# Patient Record
Sex: Male | Born: 1945 | Race: White | Hispanic: No | Marital: Married | Smoking: Never smoker
Health system: Southern US, Community
[De-identification: ages and names within clinical notes are randomized; demographics above are authoritative.]

## PROBLEM LIST (undated history)

## (undated) DIAGNOSIS — E7800 Pure hypercholesterolemia, unspecified: Secondary | ICD-10-CM

## (undated) DIAGNOSIS — I2510 Atherosclerotic heart disease of native coronary artery without angina pectoris: Secondary | ICD-10-CM

## (undated) DIAGNOSIS — I214 Non-ST elevation (NSTEMI) myocardial infarction: Secondary | ICD-10-CM

## (undated) DIAGNOSIS — I10 Essential (primary) hypertension: Secondary | ICD-10-CM

## (undated) HISTORY — PX: FINGER FRACTURE SURGERY: SHX638

## (undated) HISTORY — PX: CATARACT EXTRACTION W/ INTRAOCULAR LENS  IMPLANT, BILATERAL: SHX1307

## (undated) HISTORY — PX: FRACTURE SURGERY: SHX138

## (undated) HISTORY — PX: CORONARY ANGIOPLASTY WITH STENT PLACEMENT: SHX49

---
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
## 2018-01-07 DIAGNOSIS — M25531 Pain in right wrist: Secondary | ICD-10-CM | POA: Diagnosis not present

## 2018-05-19 DIAGNOSIS — I214 Non-ST elevation (NSTEMI) myocardial infarction: Secondary | ICD-10-CM

## 2018-05-19 HISTORY — DX: Non-ST elevation (NSTEMI) myocardial infarction: I21.4

## 2018-05-20 ENCOUNTER — Inpatient Hospital Stay (HOSPITAL_COMMUNITY)
Admission: EM | Admit: 2018-05-20 | Discharge: 2018-05-21 | DRG: 247 | Disposition: A | Discharge status: Home / Self Care | Payer: Medicare Other | Attending: Cardiology | Admitting: Cardiology

## 2018-05-20 ENCOUNTER — Other Ambulatory Visit: Payer: Self-pay

## 2018-05-20 ENCOUNTER — Encounter (HOSPITAL_COMMUNITY): Payer: Self-pay | Admitting: *Deleted

## 2018-05-20 ENCOUNTER — Encounter (HOSPITAL_COMMUNITY)
Admission: EM | Disposition: A | Discharge status: Home / Self Care | Payer: Self-pay | Source: Home / Self Care | Attending: Cardiology

## 2018-05-20 ENCOUNTER — Emergency Department (HOSPITAL_COMMUNITY): Payer: Medicare Other

## 2018-05-20 DIAGNOSIS — Z888 Allergy status to other drugs, medicaments and biological substances status: Secondary | ICD-10-CM

## 2018-05-20 DIAGNOSIS — E785 Hyperlipidemia, unspecified: Secondary | ICD-10-CM | POA: Diagnosis present

## 2018-05-20 DIAGNOSIS — I25110 Atherosclerotic heart disease of native coronary artery with unstable angina pectoris: Secondary | ICD-10-CM | POA: Diagnosis present

## 2018-05-20 DIAGNOSIS — I10 Essential (primary) hypertension: Secondary | ICD-10-CM | POA: Diagnosis present

## 2018-05-20 DIAGNOSIS — Z79899 Other long term (current) drug therapy: Secondary | ICD-10-CM | POA: Diagnosis not present

## 2018-05-20 DIAGNOSIS — I214 Non-ST elevation (NSTEMI) myocardial infarction: Secondary | ICD-10-CM | POA: Diagnosis present

## 2018-05-20 DIAGNOSIS — Z9861 Coronary angioplasty status: Secondary | ICD-10-CM

## 2018-05-20 DIAGNOSIS — Z955 Presence of coronary angioplasty implant and graft: Secondary | ICD-10-CM

## 2018-05-20 DIAGNOSIS — R079 Chest pain, unspecified: Secondary | ICD-10-CM | POA: Diagnosis present

## 2018-05-20 HISTORY — DX: Atherosclerotic heart disease of native coronary artery without angina pectoris: I25.10

## 2018-05-20 HISTORY — DX: Pure hypercholesterolemia, unspecified: E78.00

## 2018-05-20 HISTORY — DX: Essential (primary) hypertension: I10

## 2018-05-20 HISTORY — PX: LEFT HEART CATH AND CORONARY ANGIOGRAPHY: CATH118249

## 2018-05-20 HISTORY — PX: CORONARY STENT INTERVENTION: CATH118234

## 2018-05-20 HISTORY — DX: Non-ST elevation (NSTEMI) myocardial infarction: I21.4

## 2018-05-20 LAB — BASIC METABOLIC PANEL
Anion gap: 8 (ref 5–15)
BUN: 17 mg/dL (ref 8–23)
CO2: 28 mmol/L (ref 22–32)
Calcium: 9.5 mg/dL (ref 8.9–10.3)
Chloride: 101 mmol/L (ref 98–111)
Creatinine, Ser: 1 mg/dL (ref 0.61–1.24)
GFR calc Af Amer: >60 mL/min (ref >60)
GFR calc non Af Amer: >60 mL/min (ref >60)
Glucose, Bld: 99 mg/dL (ref 70–99)
Potassium: 3.7 mmol/L (ref 3.5–5.1)
Sodium: 137 mmol/L (ref 135–145)

## 2018-05-20 LAB — I-STAT TROPONIN, ED: Troponin i, poc: 0.17 ng/mL (ref 0.00–0.08)

## 2018-05-20 LAB — LIPID PANEL
Cholesterol: 133 mg/dL (ref 0–200)
HDL: 63 mg/dL (ref >40)
LDL Cholesterol: 61 mg/dL (ref 0–99)
Total CHOL/HDL Ratio: 2.1 RATIO
Triglycerides: 46 mg/dL (ref <150)
VLDL: 9 mg/dL (ref 0–40)

## 2018-05-20 LAB — CBC
HCT: 41.2 % (ref 39.0–52.0)
Hemoglobin: 13.6 g/dL (ref 13.0–17.0)
MCH: 30 pg (ref 26.0–34.0)
MCHC: 33 g/dL (ref 30.0–36.0)
MCV: 90.9 fL (ref 80.0–100.0)
Platelets: 320 10*3/uL (ref 150–400)
RBC: 4.53 MIL/uL (ref 4.22–5.81)
RDW: 12.3 % (ref 11.5–15.5)
WBC: 7.2 10*3/uL (ref 4.0–10.5)
nRBC: 0 % (ref 0.0–0.2)

## 2018-05-20 LAB — TROPONIN I: Troponin I: 0.13 ng/mL (ref <0.03)

## 2018-05-20 LAB — POCT ACTIVATED CLOTTING TIME
Activated Clotting Time: 290 seconds
Activated Clotting Time: 323 seconds

## 2018-05-20 LAB — PROTIME-INR
INR: 0.92
Prothrombin Time: 12.3 seconds (ref 11.4–15.2)

## 2018-05-20 LAB — HEMOGLOBIN A1C
Hgb A1c MFr Bld: 5.2 % (ref 4.8–5.6)
Mean Plasma Glucose: 102.54 mg/dL

## 2018-05-20 SURGERY — LEFT HEART CATH AND CORONARY ANGIOGRAPHY
Anesthesia: LOCAL

## 2018-05-20 MED ORDER — METOPROLOL SUCCINATE ER 25 MG PO TB24
25.0000 mg | ORAL_TABLET | Freq: Every day | ORAL | Status: DC
  Administered 2018-05-21: 25 mg via ORAL
  Filled 2018-05-20: qty 1

## 2018-05-20 MED ORDER — HEPARIN (PORCINE) IN NACL 1000-0.9 UT/500ML-% IV SOLN
INTRAVENOUS | Status: DC | PRN
  Administered 2018-05-20 (×2): 500 mL

## 2018-05-20 MED ORDER — NITROGLYCERIN 1 MG/10 ML FOR IR/CATH LAB
INTRA_ARTERIAL | Status: AC
  Filled 2018-05-20: qty 10

## 2018-05-20 MED ORDER — MIDAZOLAM HCL 2 MG/2ML IJ SOLN
INTRAMUSCULAR | Status: AC
  Filled 2018-05-20: qty 2

## 2018-05-20 MED ORDER — ASPIRIN 81 MG PO CHEW
324.0000 mg | CHEWABLE_TABLET | Freq: Once | ORAL | Status: AC
  Administered 2018-05-20: 324 mg via ORAL
  Filled 2018-05-20: qty 4

## 2018-05-20 MED ORDER — LIDOCAINE HCL (PF) 1 % IJ SOLN
INTRAMUSCULAR | Status: DC | PRN
  Administered 2018-05-20: 2 mL

## 2018-05-20 MED ORDER — LOSARTAN POTASSIUM 50 MG PO TABS
50.0000 mg | ORAL_TABLET | Freq: Every day | ORAL | Status: DC
  Filled 2018-05-20: qty 1

## 2018-05-20 MED ORDER — ASPIRIN 81 MG PO CHEW: 81.0000 mg | CHEWABLE_TABLET | ORAL | Status: DC

## 2018-05-20 MED ORDER — LOSARTAN POTASSIUM 25 MG PO TABS
25.0000 mg | ORAL_TABLET | Freq: Once | ORAL | Status: AC
  Administered 2018-05-21: 25 mg via ORAL
  Filled 2018-05-20: qty 1

## 2018-05-20 MED ORDER — HEPARIN SODIUM (PORCINE) 1000 UNIT/ML IJ SOLN
INTRAMUSCULAR | Status: DC | PRN
  Administered 2018-05-20: 3500 [IU] via INTRAVENOUS
  Administered 2018-05-20: 3000 [IU] via INTRAVENOUS

## 2018-05-20 MED ORDER — ACETAMINOPHEN 325 MG PO TABS: 650.0000 mg | ORAL_TABLET | ORAL | Status: DC | PRN

## 2018-05-20 MED ORDER — VERAPAMIL HCL 2.5 MG/ML IV SOLN
INTRAVENOUS | Status: AC
  Filled 2018-05-20: qty 2

## 2018-05-20 MED ORDER — SODIUM CHLORIDE 0.9% FLUSH: 3.0000 mL | INTRAVENOUS | Status: DC | PRN

## 2018-05-20 MED ORDER — LABETALOL HCL 5 MG/ML IV SOLN: 10.0000 mg | INTRAVENOUS | Status: AC | PRN

## 2018-05-20 MED ORDER — SODIUM CHLORIDE 0.9 % IV SOLN: 250.0000 mL | INTRAVENOUS | Status: DC | PRN

## 2018-05-20 MED ORDER — NITROGLYCERIN 1 MG/10 ML FOR IR/CATH LAB
INTRA_ARTERIAL | Status: DC | PRN
  Administered 2018-05-20: 200 ug via INTRACORONARY

## 2018-05-20 MED ORDER — LIDOCAINE HCL (PF) 1 % IJ SOLN
INTRAMUSCULAR | Status: AC
  Filled 2018-05-20: qty 30

## 2018-05-20 MED ORDER — ASPIRIN 81 MG PO CHEW: 324.0000 mg | CHEWABLE_TABLET | ORAL | Status: DC

## 2018-05-20 MED ORDER — TIROFIBAN (AGGRASTAT) BOLUS VIA INFUSION
INTRAVENOUS | Status: DC | PRN
  Administered 2018-05-20: 1655 ug via INTRAVENOUS

## 2018-05-20 MED ORDER — HEPARIN (PORCINE) IN NACL 1000-0.9 UT/500ML-% IV SOLN
INTRAVENOUS | Status: AC
  Filled 2018-05-20: qty 1000

## 2018-05-20 MED ORDER — ONDANSETRON HCL 4 MG/2ML IJ SOLN: 4.0000 mg | Freq: Four times a day (QID) | INTRAMUSCULAR | Status: DC | PRN

## 2018-05-20 MED ORDER — NITROGLYCERIN 0.4 MG SL SUBL: 0.4000 mg | SUBLINGUAL_TABLET | SUBLINGUAL | Status: DC | PRN

## 2018-05-20 MED ORDER — TICAGRELOR 90 MG PO TABS
90.0000 mg | ORAL_TABLET | Freq: Two times a day (BID) | ORAL | Status: DC
  Administered 2018-05-20 – 2018-05-21 (×2): 90 mg via ORAL
  Filled 2018-05-20 (×2): qty 1

## 2018-05-20 MED ORDER — ASPIRIN EC 81 MG PO TBEC: 81.0000 mg | DELAYED_RELEASE_TABLET | Freq: Every day | ORAL | Status: DC

## 2018-05-20 MED ORDER — ASPIRIN 300 MG RE SUPP: 300.0000 mg | RECTAL | Status: DC

## 2018-05-20 MED ORDER — IOHEXOL 350 MG/ML SOLN
INTRAVENOUS | Status: DC | PRN
  Administered 2018-05-20: 125 mL via INTRACARDIAC

## 2018-05-20 MED ORDER — METOPROLOL SUCCINATE ER 25 MG PO TB24: 25.0000 mg | ORAL_TABLET | Freq: Every day | ORAL | Status: DC

## 2018-05-20 MED ORDER — HEPARIN SODIUM (PORCINE) 1000 UNIT/ML IJ SOLN
INTRAMUSCULAR | Status: AC
  Filled 2018-05-20: qty 1

## 2018-05-20 MED ORDER — VERAPAMIL HCL 2.5 MG/ML IV SOLN
INTRAVENOUS | Status: DC | PRN
  Administered 2018-05-20: 10 mL via INTRA_ARTERIAL

## 2018-05-20 MED ORDER — MIDAZOLAM HCL 2 MG/2ML IJ SOLN
INTRAMUSCULAR | Status: DC | PRN
  Administered 2018-05-20: 1 mg via INTRAVENOUS

## 2018-05-20 MED ORDER — LOSARTAN POTASSIUM 25 MG PO TABS: 25.0000 mg | ORAL_TABLET | Freq: Every day | ORAL | Status: DC

## 2018-05-20 MED ORDER — HYDRALAZINE HCL 20 MG/ML IJ SOLN: 5.0000 mg | INTRAMUSCULAR | Status: AC | PRN

## 2018-05-20 MED ORDER — SODIUM CHLORIDE 0.9% FLUSH
3.0000 mL | Freq: Two times a day (BID) | INTRAVENOUS | Status: DC
  Administered 2018-05-20 – 2018-05-21 (×2): 3 mL via INTRAVENOUS

## 2018-05-20 MED ORDER — FENTANYL CITRATE (PF) 100 MCG/2ML IJ SOLN
INTRAMUSCULAR | Status: DC | PRN
  Administered 2018-05-20: 50 ug via INTRAVENOUS

## 2018-05-20 MED ORDER — SODIUM CHLORIDE 0.9 % IV SOLN: INTRAVENOUS | Status: DC

## 2018-05-20 MED ORDER — HEPARIN (PORCINE) 25000 UT/250ML-% IV SOLN: 750.0000 [IU]/h | INTRAVENOUS | Status: DC

## 2018-05-20 MED ORDER — HEPARIN BOLUS VIA INFUSION
4000.0000 [IU] | Freq: Once | INTRAVENOUS | Status: DC
  Filled 2018-05-20: qty 4000

## 2018-05-20 MED ORDER — FENTANYL CITRATE (PF) 100 MCG/2ML IJ SOLN
INTRAMUSCULAR | Status: AC
  Filled 2018-05-20: qty 2

## 2018-05-20 MED ORDER — ATORVASTATIN CALCIUM 80 MG PO TABS
80.0000 mg | ORAL_TABLET | Freq: Every day | ORAL | Status: DC
  Administered 2018-05-21: 80 mg via ORAL
  Filled 2018-05-20: qty 1

## 2018-05-20 MED ORDER — HEPARIN (PORCINE) 25000 UT/250ML-% IV SOLN
750.0000 [IU]/h | INTRAVENOUS | Status: AC
  Administered 2018-05-20: 750 [IU]/h via INTRAVENOUS
  Filled 2018-05-20: qty 250

## 2018-05-20 MED ORDER — SODIUM CHLORIDE 0.9 % IV SOLN: INTRAVENOUS | Status: AC

## 2018-05-20 SURGICAL SUPPLY — 20 items
BALLN SAPPHIRE 3.0X12 (BALLOONS) ×2
BALLOON SAPPHIRE 3.0X12 (BALLOONS) ×1 IMPLANT
CATH INFINITI 5FR ANG PIGTAIL (CATHETERS) ×2 IMPLANT
CATH OPTICROSS HD (CATHETERS) ×2 IMPLANT
CATH OPTITORQUE TIG 4.0 5F (CATHETERS) ×2 IMPLANT
CATH VISTA GUIDE 6FR JR4 (CATHETERS) ×2 IMPLANT
DEVICE RAD COMP TR BAND LRG (VASCULAR PRODUCTS) ×2 IMPLANT
GLIDESHEATH SLEND A-KIT 6F 22G (SHEATH) ×2 IMPLANT
GUIDEWIRE INQWIRE 1.5J.035X260 (WIRE) ×1 IMPLANT
INQWIRE 1.5J .035X260CM (WIRE) ×2
KIT ENCORE 26 ADVANTAGE (KITS) ×2 IMPLANT
KIT HEART LEFT (KITS) ×2 IMPLANT
KIT HEMO VALVE WATCHDOG (MISCELLANEOUS) ×2 IMPLANT
PACK CARDIAC CATHETERIZATION (CUSTOM PROCEDURE TRAY) ×2 IMPLANT
SLED PULL BACK IVUS (MISCELLANEOUS) ×2 IMPLANT
STENT RESOLUTE ONYX 4.5X15 (Permanent Stent) ×2 IMPLANT
SYR MEDRAD MARK 7 150ML (SYRINGE) ×2 IMPLANT
TRANSDUCER W/STOPCOCK (MISCELLANEOUS) ×2 IMPLANT
TUBING CIL FLEX 10 FLL-RA (TUBING) ×2 IMPLANT
WIRE ASAHI PROWATER 180CM (WIRE) ×2 IMPLANT

## 2018-05-20 NOTE — ED Provider Notes (Signed)
[facility] EMERGENCY DEPARTMENT Provider Note   CSN: [id number] Arrival date & time: 05/20/18  1023     History   Chief Complaint Chief Complaint  Patient presents with  . Chest Pain    HPI Tracy Figueroa is a 72 y.o. male.  Patient with history of known coronary artery disease, status post stent placement done in [place] on 04/12/18 --presents today with episodic chest pressure, no active chest symptoms.  Patient states that prior to being seen in [place], he developed some exertional chest pressure with radiation to the left jaw.  He went to a cardiologist in [place] who performed a catheterization because his symptoms were continuing to occur.  It sounds like he had one stent placed in a branch of a coronary artery.  However, his symptoms continued.  He then had a stress test which was reportedly negative with a normal EKG.  Upon returning to the [place], he continued to have exertional symptoms, resolved with nitroglycerin.  He saw cardiologist.  They reviewed his catheterization and noted another area that could have been a problem.  Patient reports having an episode of chest pain while resting in bed last night.  He denies associated diaphoresis, vomiting.  He was advised by his cardiologist to come to the emergency department today.  Again, he is currently asymptomatic.  States that he has been compliant with Brilinta.     Past Medical History:  Diagnosis Date  . Coronary artery disease     There are no active problems to display for this patient.   History reviewed. No pertinent surgical history.      Home Medications    Prior to Admission medications   Not on File    Family History No family history on file.  Social History Social History   Tobacco Use  . Smoking status: Not on file  Substance Use Topics  . Alcohol use: Not on file  . Drug use: Not on file     Allergies   Amlodipine   Review of Systems Review of  Systems  Constitutional: Negative for diaphoresis and fever.  Eyes: Negative for redness.  Respiratory: Negative for cough and shortness of breath.   Cardiovascular: Positive for chest pain. Negative for palpitations and leg swelling.  Gastrointestinal: Negative for abdominal pain, nausea and vomiting.  Genitourinary: Negative for dysuria.  Musculoskeletal: Negative for back pain and neck pain.  Skin: Negative for rash.  Neurological: Negative for syncope and light-headedness.  Psychiatric/Behavioral: The patient is not nervous/anxious.      Physical Exam Updated Vital Signs BP (!) 146/81 (BP Location: Right Arm)   Pulse (!) 49   Temp 98.6 F (37 C) (Oral)   Resp 16   SpO2 99%   Physical Exam Vitals signs and nursing note reviewed.  Constitutional:      Appearance: He is well-developed. He is not diaphoretic.  HENT:     Head: Normocephalic and atraumatic.     Mouth/Throat:     Mouth: Mucous membranes are not dry.  Eyes:     Conjunctiva/sclera: Conjunctivae normal.  Neck:     Musculoskeletal: Normal range of motion and neck supple. No muscular tenderness.     Vascular: Normal carotid pulses. No carotid bruit or JVD.     Trachea: Trachea normal. No tracheal deviation.  Cardiovascular:     Rate and Rhythm: Normal rate and regular rhythm.     Pulses: No decreased pulses.     Heart sounds: Normal heart  sounds, S1 normal and S2 normal. Heart sounds not distant. No murmur.  Pulmonary:     Effort: Pulmonary effort is normal. No respiratory distress.     Breath sounds: Normal breath sounds. No wheezing.  Chest:     Chest wall: No tenderness.  Abdominal:     General: Bowel sounds are normal.     Palpations: Abdomen is soft.     Tenderness: There is no abdominal tenderness. There is no guarding or rebound.  Skin:    General: Skin is warm and dry.     Coloration: Skin is not pale.  Neurological:     Mental Status: He is alert.      ED Treatments / Results  Labs (all  labs ordered are listed, but only abnormal results are displayed) Labs Reviewed  I-STAT TROPONIN, ED - Abnormal; Notable for the following components:      Result Value   Troponin i, poc 0.17 (*)    All other components within normal limits  BASIC METABOLIC PANEL  CBC  PROTIME-INR  TROPONIN I  HIV ANTIBODY (ROUTINE TESTING W REFLEX)  CBG MONITORING, ED    EKG EKG Interpretation  Date/Time:  Thursday May 20 2018 10:36:54 EST Ventricular Rate:  55 PR Interval:  166 QRS Duration: 82 QT Interval:  442 QTC Calculation: 422 R Axis:   65 Text Interpretation:  Sinus bradycardia with sinus arrhythmia Abnormal QRS-T angle, consider primary T wave abnormality Abnormal ECG no prior available for comparison Confirmed by Tilden Fossaees, Elizabeth [phone]([id number]) on 05/20/2018 10:56:50 AM   Radiology Dg Chest 2 View  Result Date: 05/20/2018 CLINICAL DATA:  Intermittent chest pain EXAM: CHEST - 2 VIEW COMPARISON:  None FINDINGS: Normal heart size, mediastinal contours, and pulmonary vascularity. Coronary arterial stent noted. Emphysematous changes without infiltrate, pleural effusion, or pneumothorax. EKG leads project over the upper chest. Bones demineralized with scattered costal cartilaginous calcifications. IMPRESSION: COPD changes without acute infiltrate. Electronically Signed   By: Ulyses SouthwardMark  Boles M.D.   On: 05/20/2018 11:11    Procedures Procedures (including critical care time)  Medications Ordered in ED Medications  aspirin chewable tablet 324 mg (has no administration in time range)  aspirin EC tablet 81 mg (has no administration in time range)  nitroGLYCERIN (NITROSTAT) SL tablet 0.4 mg (has no administration in time range)  acetaminophen (TYLENOL) tablet 650 mg (has no administration in time range)  ondansetron (ZOFRAN) injection 4 mg (has no administration in time range)     Initial Impression / Assessment and Plan / ED Course  I have reviewed the triage vital signs and the nursing  notes.  Pertinent labs & imaging results that were available during my care of the patient were reviewed by me and considered in my medical decision making (see chart for details).     Patient seen and examined. Work-up initiated. Medications ordered.   Vital signs reviewed and are as follows: BP (!) 146/81 (BP Location: Right Arm)   Pulse (!) 49   Temp 98.6 F (37 C) (Oral)   Resp 16   SpO2 99%   EKG reviewed by myself and by Dr. Madilyn Hookees.  There is approximately 1/2 mm of elevation in aVL with slight depression inferolaterally.  Patient has T wave inversion noted in lead III.  No old EKG for comparison.  Troponin elevated.  11:45 AM Orders placed for admission by cardiology.   CRITICAL CARE Performed by: Renne CriglerJoshua Rishard Delange PA-C Total critical care time: 30 minutes Critical care time was exclusive of separately billable  procedures and treating other patients. Critical care was necessary to treat or prevent imminent or life-threatening deterioration. Critical care was time spent personally by me on the following activities: development of treatment plan with patient and/or surrogate as well as nursing, discussions with consultants, evaluation of patient's response to treatment, examination of patient, obtaining history from patient or surrogate, ordering and performing treatments and interventions, ordering and review of laboratory studies, ordering and review of radiographic studies, pulse oximetry and re-evaluation of patient's condition.   Final Clinical Impressions(s) / ED Diagnoses   Final diagnoses:  NSTEMI (non-ST elevated myocardial infarction) (HCC)   Admit.    ED Discharge Orders    None       Renne CriglerGeiple, Terrea Bruster, Cordelia Poche-C 05/20/18 1147    Tilden Fossaees, Elizabeth, MD 05/21/18 [phone]

## 2018-05-20 NOTE — Interval H&P Note (Signed)
History and Physical Interval Note:  05/20/2018 2:03 PM  Tracy SnowJonathan Richard Figueroa  has presented today for surgery, with the diagnosis of nstemi  The various methods of treatment have been discussed with the patient and family. After consideration of risks, benefits and other options for treatment, the patient has consented to  Procedure(s): LEFT HEART CATH AND CORONARY ANGIOGRAPHY (N/A) as a surgical intervention .  The patient's history has been reviewed, patient examined, no change in status, stable for surgery.  I have reviewed the patient's chart and labs.  Questions were answered to the patient's satisfaction.    2016 Appropriate Use Criteria for Coronary Revascularization in Patients With Acute Coronary Syndrome NSTEMI/UA High Risk (TIMI Score 5-7) NSTEMI/Unstable angina, stabilized patient at high risk Link Here: https://powell.info/http://scaiaucapp.org/redirect/qit?177=412&189=435&190=439&192=444&193=447 Indication:  Revascularization by PCI or CABG of 1 or more arteries in a patient with NSTEMI or unstable angina with Stabilization after presentation High risk for clinical events  A (7) Indication: 16; Score 7    Jaedin Regina J Linnette Panella

## 2018-05-20 NOTE — Progress Notes (Signed)
ANTICOAGULATION CONSULT NOTE   Pharmacy Consult for heparin Indication: chest pain/ACS  Allergies  Allergen Reactions  . Amlodipine Swelling    Leg swelling    Patient Measurements: Height: 5\' 10"  (177.8 cm) Weight: 146 lb (66.2 kg) IBW/kg (Calculated) : 73  Vital Signs: Temp: 98.6 F (37 C) (12/19 1030) Temp Source: Oral (12/19 1030) BP: 126/76 (12/19 1600) Pulse Rate: 0 (12/19 1533)  Labs: Recent Labs    05/20/18 1048 05/20/18 1206  HGB 13.6  --   HCT 41.2  --   PLT 320  --   LABPROT  --  12.3  INR  --  0.92  CREATININE 1.00  --   TROPONINI  --  0.13*    Estimated Creatinine Clearance: 64.4 mL/min (by C-G formula based on SCr of 1 mg/dL).  Assessment: CC/HPI: 72 yo m presenting w CP  PMH: CAD - recent stent in [place] in [place]  Anticoag: none pta - iv hep for r/o acs  Renal: SCr 1  Heme/Onc: H&H 13.6/41.2, Plt 320  Now s/p cath - asked by Dr. Rosemary HolmsPatwardhan to start heparin when TR band off, and continue for 12 hrs.  Band off ~ 745 PM.  Goal of Therapy:  Heparin level 0.3-0.7 units/ml Monitor platelets by anticoagulation protocol: Yes   Plan:  Start IV heparin now at 750 units/hr (no bolus) Then check heparin level 8 hrs after heparin starts. Heparin off at 0830 tomorrow. CBC in AM.  Reece LeaderJessica Keontae Levingston, Colon FlatteryPharm D, BCPS, [facility] Clinical Pharmacist Phone [phone]  05/20/2018 8:15 PM

## 2018-05-20 NOTE — Progress Notes (Signed)
ANTICOAGULATION CONSULT NOTE - Initial Consult  Pharmacy Consult for heparin Indication: chest pain/ACS  Allergies  Allergen Reactions  . Amlodipine Swelling    Leg swelling    Patient Measurements: Height: 5\' 10"  (177.8 cm) Weight: 146 lb (66.2 kg) IBW/kg (Calculated) : 73  Vital Signs: Temp: 98.6 F (37 C) (12/19 1030) Temp Source: Oral (12/19 1030) BP: 146/81 (12/19 1030) Pulse Rate: 49 (12/19 1030)  Labs: Recent Labs    05/20/18 1048  HGB 13.6  HCT 41.2  PLT 320  CREATININE 1.00    Estimated Creatinine Clearance: 64.4 mL/min (by C-G formula based on SCr of 1 mg/dL).  Assessment: CC/HPI: 72 yo m presenting w CP  PMH: CAD - recent stent in placed in [place]  Anticoag: none pta - iv hep for r/o acs  Renal: SCr 1  Heme/Onc: H&H 13.6/41.2, Plt 320  Goal of Therapy:  Heparin level 0.3-0.7 units/ml Monitor platelets by anticoagulation protocol: Yes   Plan:  Heparin bolus 4000 units x 1 Heparin drip 750 units/hr Initial HL 2000 Daily HL CBC  Isaac BlissMichael Arvie Villarruel, PharmD, BCPS, BCCCP Clinical Pharmacist [phone]  Please check AMION for all [facility] numbers  05/20/2018 12:10 PM

## 2018-05-20 NOTE — ED Notes (Signed)
Pt taken to Xray.

## 2018-05-20 NOTE — ED Notes (Signed)
Lisabeth PickKaleigh, RN notified re: 0.13 trop and agrees to inform cardiology

## 2018-05-20 NOTE — ED Triage Notes (Signed)
To ED for eval by cardiology. Pt has had intermittent cp over the past few  months while out of the country. Pt states he had a cath with stent placement in [place] 11/11 but still with pain intermittently. Seen by cardiology here and stress test completed. Pain again last pm and this am - told to come to ED for workup. No pain now. No SOB

## 2018-05-20 NOTE — H&P (Signed)
Tracy Figueroa is an 72 y.o. male.   Chief Complaint: Chest pain  HPI:   72 y/o [name] with coronary artery disease with recent OCI to LCx/OM while in [place], now with recurrent, unstable angina symtoms.   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 He was in [place] in 04/2018 when he saw a cardiologist after progressive angina symotoms. Records from [place], mostly in [place], with partial translation to [place], were reviewed. I personally reviewed the diagnostic films from [place], although I was unable to open the interventional films. Per the records, he underwent PCI to his LCx/OM (2.25 X 18 mm DES), He has at least 50% lesion in mid section of a large, dominant RCA that was treated medically. With continued chest pain, he underwent exercise treadmill stress test with good exercise capacity, that was reported normal. Inferolateral EKG strips were not available to me to review.   [place] the [place], patient has conintued to have angina on exertion, as well as episdoes at rest. I recommended starting on antignainal therapy with metoprolol succinate 25 mg daily, as needed SL NTG, and continues his DAPT with Aspirin/Brilinta, Lipitor, reduced losartan from 50 mg to 25 mg givn his borderline blood pressure. I recommended 4 week follow up to assess his response to medical therapy.   Patient called this morning, reporting 3 episodes of angnina in the last 24 hours, two while walking, one while in bed. He was thus asked to come to the Emergency room for further evaluation. In the ED, EKG showed inferior T wave inversion and Trop elevation to 0.17. Patient is currently chest pain free.  Past Medical History:  Diagnosis Date  . Coronary artery disease     History reviewed. No pertinent surgical history.  No family history on file. Social History:  has no history on file for tobacco, alcohol, and  drug.  Allergies:  Allergies  Allergen Reactions  . Amlodipine Swelling    Leg swelling    (Not in a hospital admission)   Results for orders placed or performed during the hospital encounter of 05/20/18 (from the past 48 hour(s))  Basic metabolic panel     Status: None   Collection Time: 05/20/18 10:48 AM  Result Value Ref Range   Sodium 137 135 - 145 mmol/L   Potassium 3.7 3.5 - 5.1 mmol/L   Chloride 101 98 - 111 mmol/L   CO2 28 22 - 32 mmol/L   Glucose, Bld 99 70 - 99 mg/dL   BUN 17 8 - 23 mg/dL   Creatinine, Ser 1.611.00 0.61 - 1.24 mg/dL   Calcium 9.5 8.9 - 09.610.3 mg/dL   GFR calc non Af Amer >60 >60 mL/min   GFR calc Af Amer >60 >60 mL/min   Anion gap 8 5 - 15    Comment: Performed at [facility], [address] [place], [place] [address]  CBC     Status: None   Collection Time: 05/20/18 10:48 AM  Result Value Ref Range   WBC 7.2 4.0 - 10.5 K/uL   RBC 4.53 4.22 - 5.81 MIL/uL   Hemoglobin 13.6 13.0 - 17.0 g/dL   HCT 98.141.2 19.139.0 - 47.852.0 %   MCV 90.9 80.0 - 100.0 fL   MCH 30.0 26.0 - 34.0 pg   MCHC 33.0 30.0 - 36.0 g/dL   RDW 29.512.3 62.111.5 - 30.815.5 %   Platelets 320 150 -  400 K/uL   nRBC 0.0 0.0 - 0.2 %    Comment: Performed at [facility], [address], [place], [place] [address]  I-stat troponin, ED     Status: Abnormal   Collection Time: 05/20/18 10:51 AM  Result Value Ref Range   Troponin i, poc 0.17 (HH) 0.00 - 0.08 ng/mL   Comment NOTIFIED PHYSICIAN    Comment 3            Comment: Due to the release kinetics of cTnI, a negative result within the first hours of the onset of symptoms does not rule out myocardial infarction with certainty. If myocardial infarction is still suspected, repeat the test at appropriate intervals.    Dg Chest 2 View  Result Date: 05/20/2018 CLINICAL DATA:  Intermittent chest pain EXAM: CHEST - 2 VIEW COMPARISON:  None FINDINGS: Normal heart size, mediastinal contours, and pulmonary vascularity. Coronary arterial stent  noted. Emphysematous changes without infiltrate, pleural effusion, or pneumothorax. EKG leads project over the upper chest. Bones demineralized with scattered costal cartilaginous calcifications. IMPRESSION: COPD changes without acute infiltrate. Electronically Signed   By: Ulyses Southward M.D.   On: 05/20/2018 11:11    Review of Systems  Constitutional: Negative.   HENT: Negative.   Respiratory: Negative for shortness of breath.   Cardiovascular: Positive for chest pain (Currently chest pain free). Negative for palpitations, leg swelling and PND.  Gastrointestinal: Negative for nausea and vomiting.  Genitourinary: Negative.   Musculoskeletal: Negative.   Skin: Negative.   Neurological: Negative for dizziness and loss of consciousness.  Endo/Heme/Allergies: Does not bruise/bleed easily.  Psychiatric/Behavioral: Negative.   All other systems reviewed and are negative.   Blood pressure (!) 146/81, pulse (!) 49, temperature 98.6 F (37 C), temperature source Oral, resp. rate 16, SpO2 99 %. Physical Exam  Nursing note and vitals reviewed. Constitutional: He is oriented to person, place, and time. He appears well-developed and well-nourished. No distress.  HENT:  Head: Normocephalic and atraumatic.  Eyes: Pupils are equal, round, and reactive to light. Conjunctivae are normal.  Neck: No JVD present.  Cardiovascular: Regular rhythm, normal heart sounds and intact distal pulses. Bradycardia present. Exam reveals no gallop.  No murmur heard. GI: Soft. Bowel sounds are normal. There is no abdominal tenderness.  Musculoskeletal:        General: No tenderness or edema.  Lymphadenopathy:    He has no cervical adenopathy.  Neurological: He is alert and oriented to person, place, and time.  Skin: Skin is warm and dry.  Psychiatric: He has a normal mood and affect.    Cardiac studies: EKG 05/20/2018: Sinus rhythm 55 bpm. Normal axis. Normal conduction. Inferior T wave inversion suggestive of  ischemia.   Cath 04/12/2018: LM: Normal LAD: Mild disease LCx: Severe distal disease-->2.25 X 18 mm DES RCA: Large dominant. Mid 50-60% stenosis.  Echocardiogram   Assessment/Plan:  72 y/o [place] man with coronary artery disease (PCI to OM in 04/2018 while in [place]), now admitted with chest pain,  NSTEMI.  NSTEMI: Likely culprit is mid vessel 50-60% stenosis in large dominant RCA. Admit to telemetry. Continue aspirin/Brilinta, Start IV heparin. Continue metoprolol succinate 25 mg daily, increase losartan back to 50 mg, lipitor 80 mg daily. Hold Isosorbide mononitrate for now. Plan to cath this afternoon.  Hyperlipidemia: Continue Lipitor. Check lipid panel today.  Hypertension: As above  Elder Negus, MD 05/20/2018, 11:44 AM  Elder Negus, MD [facility]. [place] Pager: [phone] Office: [phone] If no  answer Cell [phone]

## 2018-05-21 ENCOUNTER — Encounter (HOSPITAL_COMMUNITY): Payer: Self-pay | Admitting: Cardiology

## 2018-05-21 LAB — BASIC METABOLIC PANEL
Anion gap: 10 (ref 5–15)
BUN: 16 mg/dL (ref 8–23)
CO2: 24 mmol/L (ref 22–32)
Calcium: 8.9 mg/dL (ref 8.9–10.3)
Chloride: 106 mmol/L (ref 98–111)
Creatinine, Ser: 0.97 mg/dL (ref 0.61–1.24)
GFR calc Af Amer: >60 mL/min (ref >60)
GFR calc non Af Amer: >60 mL/min (ref >60)
Glucose, Bld: 99 mg/dL (ref 70–99)
Potassium: 3.6 mmol/L (ref 3.5–5.1)
Sodium: 140 mmol/L (ref 135–145)

## 2018-05-21 LAB — CBC WITH DIFFERENTIAL/PLATELET
Abs Immature Granulocytes: 0.01 10*3/uL (ref 0.00–0.07)
Basophils Absolute: 0 10*3/uL (ref 0.0–0.1)
Basophils Relative: 1 %
Eosinophils Absolute: 0.6 10*3/uL — ABNORMAL HIGH (ref 0.0–0.5)
Eosinophils Relative: 7 %
HCT: 36.8 % — ABNORMAL LOW (ref 39.0–52.0)
Hemoglobin: 12.3 g/dL — ABNORMAL LOW (ref 13.0–17.0)
Immature Granulocytes: 0 %
Lymphocytes Relative: 21 %
Lymphs Abs: 1.8 10*3/uL (ref 0.7–4.0)
MCH: 29.5 pg (ref 26.0–34.0)
MCHC: 33.4 g/dL (ref 30.0–36.0)
MCV: 88.2 fL (ref 80.0–100.0)
Monocytes Absolute: 0.7 10*3/uL (ref 0.1–1.0)
Monocytes Relative: 8 %
Neutro Abs: 5.4 10*3/uL (ref 1.7–7.7)
Neutrophils Relative %: 63 %
Platelets: 282 10*3/uL (ref 150–400)
RBC: 4.17 MIL/uL — ABNORMAL LOW (ref 4.22–5.81)
RDW: 12.3 % (ref 11.5–15.5)
WBC: 8.5 10*3/uL (ref 4.0–10.5)
nRBC: 0 % (ref 0.0–0.2)

## 2018-05-21 LAB — HIV ANTIBODY (ROUTINE TESTING W REFLEX): HIV Screen 4th Generation wRfx: NONREACTIVE

## 2018-05-21 LAB — HEPARIN LEVEL (UNFRACTIONATED): Heparin Unfractionated: 0.34 IU/mL (ref 0.30–0.70)

## 2018-05-21 MED ORDER — LOSARTAN POTASSIUM 50 MG PO TABS: 25.0000 mg | ORAL_TABLET | Freq: Every day | ORAL | 3 refills | Status: DC

## 2018-05-21 NOTE — Plan of Care (Signed)
  Problem: Education: Goal: Knowledge of General Education information will improve Description Including pain rating scale, medication(s)/side effects and non-pharmacologic comfort measures Outcome: Progressing   

## 2018-05-21 NOTE — Progress Notes (Signed)
CARDIAC REHAB PHASE I   PRE:  Rate/Rhythm: 78 SR  BP:  Sitting: 145/82      SaO2:   MODE:  Ambulation: 340 ft   POST:  Rate/Rhythm: 98 SR, PVCs  BP:  Sitting: 155/89      SaO2:   Pt ambulated well. Reported SOB at the end of the walk. Walked with one assist. Educated on [place], [place], stent, diet, and exercise. Pt showed eagerness to exercise and was responsive to information. Pt received stent card. Pt to chair with phone and call bell at reach. Stent and heart attack video set up for Pt. CRPII was discussed and will [facility].   10:00-11:10   Tomasita CrumbleBrittany N Shereta Crothers BS, ACSM CEP  11:01 AM 05/21/2018

## 2018-05-21 NOTE — Progress Notes (Signed)
ANTICOAGULATION CONSULT NOTE   Pharmacy Consult for Heparin Indication: chest pain/ACS  Allergies  Allergen Reactions  . Amlodipine Swelling    Leg swelling    Patient Measurements: Height: 5\' 10"  (177.8 cm) Weight: 146 lb (66.2 kg) IBW/kg (Calculated) : 73  Vital Signs: Temp: 98.3 F (36.8 C) (12/20 0346) Temp Source: Oral (12/20 0346) BP: 132/88 (12/20 0346) Pulse Rate: 75 (12/20 0346)  Labs: Recent Labs    05/20/18 1048 05/20/18 1206 05/21/18 0353  HGB 13.6  --  12.3*  HCT 41.2  --  36.8*  PLT 320  --  282  LABPROT  --  12.3  --   INR  --  0.92  --   HEPARINUNFRC  --   --  0.34  CREATININE 1.00  --  0.97  TROPONINI  --  0.13*  --     Estimated Creatinine Clearance: 66.4 mL/min (by C-G formula based on SCr of 0.97 mg/dL).  Assessment: CC/HPI: 72 yo m presenting w CP  PMH: CAD - recent stent in placed in [place]  Anticoag: none pta - iv hep for r/o acs  Renal: SCr 1  Heme/Onc: H&H 13.6/41.2, Plt 320  Now s/p cath - asked by Dr. Rosemary HolmsPatwardhan to start heparin when TR band off, and continue for 12 hrs.  Band off ~ 745 PM.  12/20 AM update: heparin level therapeutic this AM, heparin off in a few hours  Goal of Therapy:  Heparin level 0.3-0.7 units/ml Monitor platelets by anticoagulation protocol: Yes   Plan:  Cont heparin at 750 units/hr Heparin off at 0830 this AM   Abran DukeJames Gwendloyn Forsee, PharmD, BCPS Clinical Pharmacist Phone: [phone]

## 2018-05-22 DIAGNOSIS — Z9861 Coronary angioplasty status: Secondary | ICD-10-CM

## 2018-05-22 NOTE — Discharge Summary (Addendum)
Physician Discharge Summary  Patient ID: Tracy SnowJonathan Figueroa Figueroa MRN: [id number] DOB/AGE: December 06, 1945 72 y.o.  Admit date: 05/20/2018 Discharge date: 05/22/2018  Admission Diagnoses: Chest pain  Discharge Diagnoses:  Active Problems:   NSTEMI (non-ST elevated myocardial infarction) (HCC)   S/P PTCA (percutaneous transluminal coronary angioplasty)   Discharged Condition: Good  Hospital Course:   72 y/o [name] with coronary artery disease (PCI to OM in 04/2018 while in [place]), was admitted to hospital on 05/20/18 with chest pain and found to have NSTEMI with peak trop 0.17 ng/mL. Cath showed 95% mid RCA stenosis successfully treated with DES, confirmed by IVUS. Details below. Patient ambulated with cardiac rehab without any chest pain. Transition of care follow up arranged.    Consults: None  Significant Diagnostic Studies: Results for Tracy SnowUDGE, Keir Figueroa (MRN [id number]) as of 05/22/2018 00:48  Ref. Range 05/21/2018 03:53  WBC Latest Ref Range: 4.0 - 10.5 K/uL 8.5  RBC Latest Ref Range: 4.22 - 5.81 MIL/uL 4.17 (L)  Hemoglobin Latest Ref Range: 13.0 - 17.0 g/dL 56.212.3 (L)  HCT Latest Ref Range: 39.0 - 52.0 % 36.8 (L)  MCV Latest Ref Range: 80.0 - 100.0 fL 88.2  MCH Latest Ref Range: 26.0 - 34.0 pg 29.5  MCHC Latest Ref Range: 30.0 - 36.0 g/dL 13.033.4  RDW Latest Ref Range: 11.5 - 15.5 % 12.3  Platelets Latest Ref Range: 150 - 400 K/uL 282  nRBC Latest Ref Range: 0.0 - 0.2 % 0.0   Results for Tracy SnowUDGE, Tracy Figueroa (MRN [id number]) as of 05/22/2018 00:48  Ref. Range 05/20/2018 10:48 05/20/2018 10:51 05/20/2018 12:06 05/21/2018 03:53  BASIC METABOLIC PANEL Unknown Rpt   Rpt  Sodium Latest Ref Range: 135 - 145 mmol/L 137   140  Potassium Latest Ref Range: 3.5 - 5.1 mmol/L 3.7   3.6  Chloride Latest Ref Range: 98 - 111 mmol/L 101   106  CO2 Latest Ref Range: 22 - 32 mmol/L 28   24  Glucose Latest Ref Range: 70 - 99 mg/dL 99   99  Mean Plasma Glucose Latest Units:  mg/dL 295.28102.54     BUN Latest Ref Range: 8 - 23 mg/dL 17   16  Creatinine Latest Ref Range: 0.61 - 1.24 mg/dL 4.131.00   2.440.97  Calcium Latest Ref Range: 8.9 - 10.3 mg/dL 9.5   8.9  Anion gap Latest Ref Range: 5 - 15  8   10   GFR, Est Non African American Latest Ref Range: >60 mL/min >60   >60  GFR, Est African American Latest Ref Range: >60 mL/min >60   >60  Troponin I Latest Ref Range: <0.03 ng/mL   0.13 (HH)   Troponin i, poc Latest Ref Range: 0.00 - 0.08 ng/mL  0.17 (HH)    Total CHOL/HDL Ratio Latest Units: RATIO 2.1     Cholesterol Latest Ref Range: 0 - 200 mg/dL 010133     HDL Cholesterol Latest Ref Range: >40 mg/dL 63     LDL (calc) Latest Ref Range: 0 - 99 mg/dL 61     Triglycerides Latest Ref Range: <150 mg/dL 46     VLDL Latest Ref Range: 0 - 40 mg/dL 9       Treatments: Cath 05/20/2018: LM: Normal LAD: Mid 20% disease LCx: Patent distal LCx stent RCA: Mid 99% stenosis         Successful PTCA and stent placement         Resolute Onyx 4.5 X 15 mm DES  0% residual stenosis with excellent apposition and expansion confirmed on IVUS.          Prox calcific 40% stenosis.  LVEPD Normal LVEF 45-50% with inferior hypokinesis.  Discharge Exam: Blood pressure 134/86, pulse (!) 101, temperature 98.6 F (37 C), temperature source Oral, resp. rate 13, height 5\' 10"  (1.778 m), weight 66.2 kg, SpO2 99 %.  Nursing note and vitals reviewed. Constitutional: He is oriented to person, place, and time. He appears well-developed and well-nourished. No distress.  HENT:  Head: Normocephalic and atraumatic.  Eyes: Pupils are equal, round, and reactive to light. Conjunctivae are normal.  Neck: No JVD present.  Cardiovascular: Regular rhythm, normal heart sounds and intact distal pulses. Bradycardia present. Exam reveals no gallop.  No murmur heard. GI: Soft. Bowel sounds are normal. There is no abdominal tenderness.  Musculoskeletal:        General: No tenderness or edema.   Lymphadenopathy:    He has no cervical adenopathy.  Neurological: He is alert and oriented to person, place, and time.  Skin: Skin is warm and dry.  Psychiatric: He has a normal mood and affect.   Disposition: Home  Discharge Instructions    Amb Referral to Cardiac Rehabilitation   Complete by:  As directed    Diagnosis:   Coronary Stents PTCA NSTEMI     Diet - low sodium heart healthy   Complete by:  As directed    Increase activity slowly   Complete by:  As directed      Allergies as of 05/21/2018      Reactions   Amlodipine Swelling   Leg swelling      Medication List    STOP taking these medications   sildenafil 50 MG tablet Commonly known as:  VIAGRA     TAKE these medications   aspirin EC 81 MG tablet Take 81 mg by mouth daily.   atorvastatin 80 MG tablet Commonly known as:  LIPITOR Take 80 mg by mouth daily.   BRILINTA 90 MG Tabs tablet Generic drug:  ticagrelor Take 90 mg by mouth 2 (two) times daily.   losartan 50 MG tablet Commonly known as:  COZAAR Take 0.5 tablets (25 mg total) by mouth daily. What changed:  medication strength   metoprolol succinate 25 MG 24 hr tablet Commonly known as:  TOPROL-XL Take 25 mg by mouth daily.   multivitamin with minerals Tabs tablet Take 1 tablet by mouth daily.   nitroGLYCERIN 0.4 MG SL tablet Commonly known as:  NITROSTAT Place 0.4 mg under the tongue every 5 (five) minutes as needed for chest pain.   vitamin C 1000 MG tablet Take 1,000 mg by mouth daily.      Follow-up Information    Elder NegusPatwardhan, Devyn Griffing J, MD Follow up on 05/28/2018.   Specialty:  Cardiology Why:  9:15 Contact information: [address] [place] [place] [address] [phone]           Signed: Elder NegusManish J Thurmon Mizell 05/22/2018, 12:43 AM  Elder NegusManish J Celester Lech, MD [facility] Pager: [phone] Office: [phone] If no answer Cell [phone]

## 2018-05-27 ENCOUNTER — Telehealth (HOSPITAL_COMMUNITY): Payer: Self-pay

## 2018-05-27 NOTE — Telephone Encounter (Signed)
Called patient to see if he is interested in the Cardiac Rehab Program. Patient expressed interest. Explained scheduling process and went over insurance, patient verbalized understanding. Will contact patient for scheduling once f/u has been completed.  °

## 2018-05-27 NOTE — Telephone Encounter (Signed)
Pt insurance is active and benefits verified through [facility]. Co-pay $20.00, DED $0.00/$0.00 met, out of pocket $4,000.00/$4,000.00 met, co-insurance 0%. No pre-authorization required. Passport, 05/27/18 @ 10:27AM, REF# [id number]  Will contact patient to see if he is interested in the Cardiac Rehab Program. If interested, patient will need to complete follow up appt. Once completed, patient will be contacted for scheduling upon review by the RN Navigator.

## 2018-07-28 ENCOUNTER — Ambulatory Visit (INDEPENDENT_AMBULATORY_CARE_PROVIDER_SITE_OTHER): Payer: Medicare Other | Admitting: Cardiology

## 2018-07-28 ENCOUNTER — Other Ambulatory Visit: Payer: Self-pay

## 2018-07-28 ENCOUNTER — Telehealth: Payer: Self-pay

## 2018-07-28 VITALS — BP 166/95

## 2018-07-28 DIAGNOSIS — I10 Essential (primary) hypertension: Secondary | ICD-10-CM

## 2018-07-28 MED ORDER — LOSARTAN POTASSIUM 50 MG PO TABS: 50.0000 mg | ORAL_TABLET | Freq: Every day | ORAL | 3 refills | Status: DC

## 2018-07-28 NOTE — Telephone Encounter (Signed)
Please review his medications. Per my last OV note, he was on metoprolol 25 mg bid, losartan 25 mg daily. If he is taking both, please ask him to increase losartan to 50 mg daily. If he is not taking losartan, please ask him to start losartan 25 mg daily. Please send prescriptions for losartan, if currently not taking it. Will discuss further during upcoming office visit.   Thanks MJP

## 2018-07-28 NOTE — Patient Instructions (Signed)
Please review his medications. Per my last OV note, he was on metoprolol 25 mg bid, losartan 25 mg daily. If he is taking both, please ask him to increase losartan to 50 mg daily. If he is not taking losartan, please ask him to start losartan 25 mg daily. Please send prescriptions for losartan, if currently not taking it. Will discuss further during upcoming office visit.   Thanks MJPv

## 2018-07-28 NOTE — Telephone Encounter (Signed)
Patient called and stated that his BP has changed since his last OV  In December. He stated that you took him off one of his medications at that time. His Bp is usually 120/80 now it runs about 170/90 . Please advise.

## 2018-07-28 NOTE — Progress Notes (Signed)
Increased losartan to 50 mg daily.

## 2018-08-23 ENCOUNTER — Other Ambulatory Visit: Payer: Self-pay | Admitting: Cardiology

## 2018-08-24 LAB — LIPID PANEL W/O CHOL/HDL RATIO
Cholesterol, Total: 137 mg/dL (ref 100–199)
HDL: 68 mg/dL (ref >39)
LDL Calculated: 55 mg/dL (ref 0–99)
Triglycerides: 69 mg/dL (ref 0–149)
VLDL Cholesterol Cal: 14 mg/dL (ref 5–40)

## 2018-08-26 ENCOUNTER — Encounter: Payer: Self-pay | Admitting: Cardiology

## 2018-08-26 ENCOUNTER — Other Ambulatory Visit: Payer: Self-pay

## 2018-08-26 ENCOUNTER — Ambulatory Visit (INDEPENDENT_AMBULATORY_CARE_PROVIDER_SITE_OTHER): Payer: Medicare Other | Admitting: Cardiology

## 2018-08-26 VITALS — BP 125/75 | Ht 70.0 in | Wt 145.0 lb

## 2018-08-26 DIAGNOSIS — I2510 Atherosclerotic heart disease of native coronary artery without angina pectoris: Secondary | ICD-10-CM

## 2018-08-26 NOTE — Progress Notes (Addendum)
Virtual Visit via Video Note   Subjective:   Tracy Figueroa, male    DOB: 30-Sep-1945, 73 y.o.   MRN: [id number]   I connected with the patient on 08/26/18 by a video enabled telemedicine application and verified that I am speaking with the correct person using two identifiers.     I discussed the limitations of evaluation and management by telemedicine and the availability of in person appointments. The patient expressed understanding and agreed to proceed.   Chief complaint:  Coronary artery disease  73 year old [name] coronary artery disease, PCI to OM1 and residual 04/2018 in [place], PCI to large mid RCA for non-STEMI in 05/2018 by me.  He has been doing very well. Until recently when the [facility] gym closed due to COVID 19 outbreak, he had been averaging 7,000-12,000 steps/day without any complaints of chest pain, shortness of breath symptoms. His BP had ups and downs. In 07/2018, I recommended increasing his losartan to 50 mg daily. While this temporarily improved his BP, it increased again. He started using Diupress 12.5 mg daily (he got this from [place]). Per my review, it is a combination of risperine and chlorothiazide. His BP is now very well controlled.   Past Medical History:  Diagnosis Date  . Coronary artery disease   . High cholesterol   . Hypertension   . NSTEMI (non-ST elevated myocardial infarction) (HCC) 05/19/2018     Past Surgical History:  Procedure Laterality Date  . CATARACT EXTRACTION W/ INTRAOCULAR LENS  IMPLANT, BILATERAL Bilateral 2000s  . CORONARY ANGIOPLASTY WITH STENT PLACEMENT  04/12/2018; 05/20/2018   "in [place]; [facility]"  . CORONARY STENT INTERVENTION N/A 05/20/2018   Procedure: CORONARY STENT INTERVENTION;  Surgeon: Elder Negus, MD;  Location: [facility] INVASIVE CV LAB;  Service: Cardiovascular;  Laterality: N/A;  . FINGER FRACTURE SURGERY Left    "pinky"  . FRACTURE SURGERY    . INGUINAL HERNIA REPAIR  1958  . LEFT  HEART CATH AND CORONARY ANGIOGRAPHY N/A 05/20/2018   Procedure: LEFT HEART CATH AND CORONARY ANGIOGRAPHY;  Surgeon: Elder Negus, MD;  Location: [facility] INVASIVE CV LAB;  Service: Cardiovascular;  Laterality: N/A;     Social History   Socioeconomic History  . Marital status: Married    Spouse name: Not on file  . Number of children: 0  . Years of education: Not on file  . Highest education level: Not on file  Occupational History  . Not on file  Social Needs  . Financial resource strain: Not on file  . Food insecurity:    Worry: Not on file    Inability: Not on file  . Transportation needs:    Medical: Not on file    Non-medical: Not on file  Tobacco Use  . Smoking status: Never Smoker  . Smokeless tobacco: Never Used  Substance and Sexual Activity  . Alcohol use: Yes    Alcohol/week: 9.0 standard drinks    Types: 9 Glasses of wine per week  . Drug use: Not Currently    Types: Marijuana    Comment: 05/20/2018 "last weed was 1970"  . Sexual activity: Not on file  Lifestyle  . Physical activity:    Days per week: Not on file    Minutes per session: Not on file  . Stress: Not on file  Relationships  . Social connections:    Talks on phone: Not on file    Gets together: Not on file    Attends religious service: Not  on file    Active member of club or organization: Not on file    Attends meetings of clubs or organizations: Not on file    Relationship status: Not on file  . Intimate partner violence:    Fear of current or ex partner: Not on file    Emotionally abused: Not on file    Physically abused: Not on file    Forced sexual activity: Not on file  Other Topics Concern  . Not on file  Social History Narrative  . Not on file     Current Outpatient Medications on File Prior to Visit  Medication Sig Dispense Refill  . Ascorbic Acid (VITAMIN C) 1000 MG tablet Take 1,000 mg by mouth daily.    Marland Kitchen aspirin EC 81 MG tablet Take 81 mg by mouth daily.    Marland Kitchen  atorvastatin (LIPITOR) 80 MG tablet Take 80 mg by mouth daily.    Marland Kitchen losartan (COZAAR) 50 MG tablet Take 1 tablet (50 mg total) by mouth daily. 90 tablet 3  . metoprolol succinate (TOPROL-XL) 25 MG 24 hr tablet Take 25 mg by mouth daily.    . Multiple Vitamin (MULTIVITAMIN WITH MINERALS) TABS tablet Take 1 tablet by mouth daily.    . nitroGLYCERIN (NITROSTAT) 0.4 MG SL tablet Place 0.4 mg under the tongue every 5 (five) minutes as needed for chest pain.    . ticagrelor (BRILINTA) 90 MG TABS tablet Take 90 mg by mouth 2 (two) times daily.     No current facility-administered medications on file prior to visit.     Cardiovascular studies:  Coronary angiogram 05/2018: LM: Normal LAD: Mid 20% disease LCx: Patent distal LCx stent RCA: Mid 99% stenosis         Successful PTCA and stent placement         Resolute Onyx 4.5 X 15 mm DES         0% residual stenosis with excellent apposition and expansion confirmed on IVUS.          Prox calcific 40% stenosis.  LVEPD Normal LVEF 45-50% with inferior hypokinesis. Recent labs: Results for Tracy Figueroa (MRN [id number]) as of 08/26/2018 13:36  Ref. Range 08/23/2018 07:53  Cholesterol, Total Latest Ref Range: 100 - 199 mg/dL 409  HDL Cholesterol Latest Ref Range: >39 mg/dL 68  LDL (calc) Latest Ref Range: 0 - 99 mg/dL 55  Triglycerides Latest Ref Range: 0 - 149 mg/dL 69  VLDL Cholesterol Cal Latest Ref Range: 5 - 40 mg/dL 14   Results for Tracy Figueroa (MRN [id number]) as of 08/26/2018 13:36  Ref. Range 05/20/2018 10:48  Total CHOL/HDL Ratio Latest Units: RATIO 2.1  Cholesterol Latest Ref Range: 0 - 200 mg/dL 956  HDL Cholesterol Latest Ref Range: >40 mg/dL 63  LDL (calc) Latest Ref Range: 0 - 99 mg/dL 61  Triglycerides Latest Ref Range: <150 mg/dL 46  VLDL Latest Ref Range: 0 - 40 mg/dL 9    Review of Systems  Constitution: Negative for decreased appetite, malaise/fatigue, weight gain and weight loss.  HENT: Negative for  congestion.   Eyes: Negative for visual disturbance.  Cardiovascular: Negative for chest pain, dyspnea on exertion, leg swelling, palpitations and syncope.  Respiratory: Negative for shortness of breath.   Endocrine: Negative for cold intolerance.  Hematologic/Lymphatic: Does not bruise/bleed easily.  Skin: Negative for itching and rash.  Musculoskeletal: Negative for myalgias.  Gastrointestinal: Negative for abdominal pain, nausea and vomiting.  Genitourinary: Negative for dysuria.  Neurological: Negative  for dizziness and weakness.  Psychiatric/Behavioral: The patient is not nervous/anxious.   All other systems reviewed and are negative.        Vitals:   08/26/18 1427  BP: 125/75   (Measured by the patient using a home BP monitor)   Observation/findings during video visit   Objective:   Physical Exam  Constitutional: He is oriented to person, place, and time. He appears well-developed and well-nourished. No distress.  HENT:  Head: Normocephalic and atraumatic.  Neck: No JVD present.  Pulmonary/Chest: Effort normal.  Neurological: He is alert and oriented to person, place, and time.  Psychiatric: He has a normal mood and affect.  Nursing note and vitals reviewed.         Assessment & Recommendations:   73 year old [name] coronary artery disease, PCI to OM1 and residual 04/2018, PCI to large mid RCA for non-STEMI in 05/2018 by me.  1. Coronary artery disease involving native coronary artery of native heart without angina pectoris: Status post RCA PCI for non-STEMI in 05/2018. Prior PCI to left circumflex in 04/2018 in [place]. Continue dual antiplatelet therapy with aspirin and Brilinta at least till 05/2019.  Continue Lipitor 80 mg daily. Lipid panel in 08/2018 is favorable. Continue metoprolol succinate 25 mg daily, losartan 50 mg dail, Diupress 12.5 mg daily. In future, when he runs out of losartan and/or Dipuress, he can be placed on  losartan-HCTZ 50-12.5 mg daily.. Patient knows not to use sildenafil concurrently with NTG. He has not had to use any NTG.  Return visit in second week of August 2020, to time with his travel plans.     Elder Negus, MD [facility] Pager: [phone] Office: [phone] If no answer Cell [phone]

## 2018-08-27 ENCOUNTER — Telehealth: Payer: Self-pay | Admitting: Cardiology

## 2018-08-27 ENCOUNTER — Telehealth (HOSPITAL_COMMUNITY): Payer: Self-pay

## 2018-08-27 NOTE — Telephone Encounter (Signed)
Called patient to see if he is interested in the Cardiac Rehab Program. Patient stated he is currently exercising at a gym.  Closed referral

## 2018-08-30 ENCOUNTER — Other Ambulatory Visit: Payer: Self-pay

## 2018-08-30 ENCOUNTER — Telehealth: Payer: Self-pay

## 2018-08-30 ENCOUNTER — Encounter: Payer: Self-pay | Admitting: Cardiology

## 2018-08-30 ENCOUNTER — Ambulatory Visit: Payer: Medicare Other | Admitting: Cardiology

## 2018-08-30 VITALS — BP 126/82 | HR 66 | Ht 70.0 in | Wt 144.0 lb

## 2018-08-30 DIAGNOSIS — R079 Chest pain, unspecified: Secondary | ICD-10-CM | POA: Diagnosis not present

## 2018-08-30 DIAGNOSIS — I2510 Atherosclerotic heart disease of native coronary artery without angina pectoris: Secondary | ICD-10-CM | POA: Diagnosis not present

## 2018-08-30 NOTE — Progress Notes (Signed)
Follow up visit  Subjective:   Tracy Figueroa, male    DOB: 19-Nov-1945, 73 y.o.   MRN: [id number]   Chief Complaint  Patient presents with  . Chest Pain     HPI  73 year old [name] coronary artery disease, PCI to OM1 and residual 04/2018, PCI to large mid RCA for non-STEMI in 05/2018 by me.  I had a virtual visit with the patient on 08/26/2018. He had been doing well. However, he had the following episode today. He was on his early monring walk. Halfway through the walk, he felt sensation of jaw pain and pressure, similar to his episodes that occurred before his first stent in 04/2018 while in [place]. Episode lasted for 30 seconds and went away on its own. He was able to continue his walk after that, without any difficulty. Similar episode occurred again, when patient returned home and was doing push ups, again lasting for <60 seconds.  BP at home was normal. HR was around 63 bpm. Patient recalls that this episode is distinctly different from his episode of much more severe and longer lasting chest pain, that occurred in 05/2018 (cath then showed 99% mid RCA stenosis, for which he underwent successful PCI). He has not had any recurrence of these symptoms again since this morning.   While he is concerned about this episode, he is concerned about exposure to COVID 19, and wary of going to the ED. Thus, he is here for an urgent visit with me.     Past Medical History:  Diagnosis Date  . Coronary artery disease   . High cholesterol   . Hypertension   . NSTEMI (non-ST elevated myocardial infarction) (HCC) 05/19/2018     Past Surgical History:  Procedure Laterality Date  . CATARACT EXTRACTION W/ INTRAOCULAR LENS  IMPLANT, BILATERAL Bilateral 2000s  . CORONARY ANGIOPLASTY WITH STENT PLACEMENT  04/12/2018; 05/20/2018   "in [place]; [facility]"  . CORONARY STENT INTERVENTION N/A 05/20/2018   Procedure: CORONARY STENT INTERVENTION;  Surgeon: Elder Negus, MD;   Location: [facility];  Service: Cardiovascular;  Laterality: N/A;  . FINGER FRACTURE SURGERY Left    "pinky"  . FRACTURE SURGERY    . INGUINAL HERNIA REPAIR  1958  . LEFT HEART CATH AND CORONARY ANGIOGRAPHY N/A 05/20/2018   Procedure: LEFT HEART CATH AND CORONARY ANGIOGRAPHY;  Surgeon: Elder Negus, MD;  Location: [facility];  Service: Cardiovascular;  Laterality: N/A;     Social History   Socioeconomic History  . Marital status: Married    Spouse name: Not on file  . Number of children: 0  . Years of education: Not on file  . Highest education level: Not on file  Occupational History  . Not on file  Social Needs  . Financial resource strain: Not on file  . Food insecurity:    Worry: Not on file    Inability: Not on file  . Transportation needs:    Medical: Not on file    Non-medical: Not on file  Tobacco Use  . Smoking status: Never Smoker  . Smokeless tobacco: Never Used  Substance and Sexual Activity  . Alcohol use: Yes    Alcohol/week: 9.0 standard drinks    Types: 9 Glasses of wine per week  . Drug use: Not Currently    Types: Marijuana    Comment: 05/20/2018 "last weed was 1970"  . Sexual activity: Not on file  Lifestyle  . Physical activity:  Days per week: Not on file    Minutes per session: Not on file  . Stress: Not on file  Relationships  . Social connections:    Talks on phone: Not on file    Gets together: Not on file    Attends religious service: Not on file    Active member of club or organization: Not on file    Attends meetings of clubs or organizations: Not on file    Relationship status: Not on file  . Intimate partner violence:    Fear of current or ex partner: Not on file    Emotionally abused: Not on file    Physically abused: Not on file    Forced sexual activity: Not on file  Other Topics Concern  . Not on file  Social History Narrative  . Not on file     Current Outpatient Medications on File Prior to  Visit  Medication Sig Dispense Refill  . Ascorbic Acid (VITAMIN C) 1000 MG tablet Take 1,000 mg by mouth daily.    Marland Kitchen aspirin EC 81 MG tablet Take 81 mg by mouth daily.    Marland Kitchen atorvastatin (LIPITOR) 80 MG tablet Take 80 mg by mouth daily.    Marland Kitchen losartan (COZAAR) 50 MG tablet Take 1 tablet (50 mg total) by mouth daily. 90 tablet 3  . metoprolol succinate (TOPROL-XL) 25 MG 24 hr tablet Take 25 mg by mouth daily.    . Multiple Vitamin (MULTIVITAMIN WITH MINERALS) TABS tablet Take 1 tablet by mouth daily.    . NON FORMULARY Take 12.5 mg by mouth daily. diupress 25 mg    . ticagrelor (BRILINTA) 90 MG TABS tablet Take 90 mg by mouth 2 (two) times daily.    . nitroGLYCERIN (NITROSTAT) 0.4 MG SL tablet Place 0.4 mg under the tongue every 5 (five) minutes as needed for chest pain.     No current facility-administered medications on file prior to visit.     Cardiovascular studies:  EKG 08/30/2018: Sinus rhythm 55 bpm. Normal axis. Normal conduction. Nonspoecific ST-T changes inferolateral leads. Inferior T wave inversion from 05/2019 EKG no longer seen. Coronary angiogram 05/2018: LM: Normal LAD: Mid 20% disease LCx: Patent distal LCx stent RCA: Mid 99% stenosis Successful PTCA and stent placement Resolute Onyx 4.5 X 15 mm DES 0% residual stenosis with excellent apposition and expansion confirmed on IVUS.  Prox calcific 40% stenosis.  LVEPD Normal LVEF 45-50% with inferior hypokinesis. Recent labs: Results for SCIPIO, PRESSNALL (MRN [id number]) as of 08/26/2018 13:36  Ref. Range 08/23/2018 07:53  Cholesterol, Total Latest Ref Range: 100 - 199 mg/dL 149  HDL Cholesterol Latest Ref Range: >39 mg/dL 68  LDL (calc) Latest Ref Range: 0 - 99 mg/dL 55  Triglycerides Latest Ref Range: 0 - 149 mg/dL 69  VLDL Cholesterol Cal Latest Ref Range: 5 - 40 mg/dL 14   Results for ASHAY, MALIA (MRN [id number]) as of 08/26/2018 13:36  Ref. Range 05/20/2018  10:48  Total CHOL/HDL Ratio Latest Units: RATIO 2.1  Cholesterol Latest Ref Range: 0 - 200 mg/dL 850  HDL Cholesterol Latest Ref Range: >40 mg/dL 63  LDL (calc) Latest Ref Range: 0 - 99 mg/dL 61  Triglycerides Latest Ref Range: <150 mg/dL 46  VLDL Latest Ref Range: 0 - 40 mg/dL 9    Review of Systems  Constitution: Negative for decreased appetite, malaise/fatigue, weight gain and weight loss.  HENT: Negative for congestion.   Eyes: Negative for visual disturbance.  Cardiovascular: Positive for  chest pain (Two episode this morning). Negative for dyspnea on exertion, leg swelling, palpitations and syncope.  Respiratory: Negative for shortness of breath.   Endocrine: Negative for cold intolerance.  Hematologic/Lymphatic: Does not bruise/bleed easily.  Skin: Negative for itching and rash.  Musculoskeletal: Negative for myalgias.  Gastrointestinal: Negative for abdominal pain, nausea and vomiting.  Genitourinary: Negative for dysuria.  Neurological: Negative for dizziness and weakness.  Psychiatric/Behavioral: The patient is not nervous/anxious.   All other systems reviewed and are negative.        Vitals:   08/30/18 1537  BP: 126/82  Pulse: 66  SpO2: 95%    Objective:   Physical Exam  Constitutional: He is oriented to person, place, and time. He appears well-developed and well-nourished. No distress.  HENT:  Head: Normocephalic and atraumatic.  Eyes: Pupils are equal, round, and reactive to light. Conjunctivae are normal.  Neck: No JVD present.  Cardiovascular: Normal rate, regular rhythm and intact distal pulses.  Pulmonary/Chest: Effort normal and breath sounds normal. He has no wheezes. He has no rales.  Abdominal: Soft. Bowel sounds are normal. There is no rebound.  Musculoskeletal:        General: No edema.  Lymphadenopathy:    He has no cervical adenopathy.  Neurological: He is alert and oriented to person, place, and time. No cranial nerve deficit.  Skin: Skin  is warm and dry.  Psychiatric: He has a normal mood and affect.  Nursing note and vitals reviewed.         Assessment & Recommendations:   73 year old [name] coronary artery disease, PCI to OM1 and residual 04/2018, PCI to large mid RCA for non-STEMI in 05/2018 by me.  1. Chest pain, unspecified type Patient had 2 episodes of chest and jaw pain this morning lasting for less than 60 seconds.  While description and quality of symptoms are concerning for angina, duration is very atypical for this to be angina symptom.  His non-STEMI in December 2019 had distinctly different quality and duration of chest pain.  EKG today is unremarkable.  Patient's episode occurred more than 8 hours ago.  I will obtain a stat troponin.  If negative, this will reliably rule out ACS.  Given that he had similar episodes before his full stenting vessel in November 2019, I will continue to monitor his symptoms over the next few days.  In the past, he has had completely normal stress test just before his non-STEMI with 99% mid RCA stenosis in December 2019.  If he has recurrent chest pain episodes, I will then proceed with cardiac catheterization for unstable angina rather than repeating stress test at this time.  If his troponin I is positive today, I will then admit him to the hospital for non-STEMI and proceed with cardiac catheterization tomorrow.  2. Coronary artery disease involving native coronary artery of native heart without angina pectoris Status post RCA PCI for non-STEMI in 05/2018. Prior PCI to left circumflex in 04/2018 in [place]. Continue dual antiplatelet therapy with aspirin and Brilinta at least till 05/2019.  Continue Lipitor 80 mg daily. Lipid panel in 08/2018 is favorable. Continue metoprolol succinate 25 mg daily, losartan 50 mg dail, Diupress 12.5 mg daily. In future, when he runs out of losartan and/or Dipuress, he can be placed on losartan-HCTZ 50-12.5 mg daily.. Patient knows  not to use sildenafil concurrently with NTG. He has not had to use any NTG.  If negative troponin and no recurrent chest pain symptoms, I will see  him for scheduled follow-up in August 2020.   Elder Negus, MD [facility] [place] Pager: [phone] Office: [phone] If no answer Cell [phone]

## 2018-08-30 NOTE — Progress Notes (Signed)
Pt aware.

## 2018-08-30 NOTE — Telephone Encounter (Signed)
Pt state that he had another episode like November,pressure on the chest, no pain, no sob, a weird sensation on left jaw.please advise 

## 2018-08-30 NOTE — Telephone Encounter (Signed)
Spoke with the patient. He will be here for urgent visit. Will need EKG.  Thanks MJP

## 2018-08-30 NOTE — Telephone Encounter (Signed)
Pt state that he had another episode like November,pressure on the chest, no pain, no sob, a weird sensation on left jaw.please advise

## 2018-08-31 LAB — TROPONIN I: Troponin I: 0.01 ng/mL (ref 0.00–0.04)

## 2018-08-31 NOTE — Telephone Encounter (Signed)
We can have a virtual visit or he can come in

## 2018-09-27 ENCOUNTER — Telehealth: Payer: Medicare Other

## 2018-09-27 DIAGNOSIS — R0789 Other chest pain: Secondary | ICD-10-CM

## 2018-09-27 NOTE — Telephone Encounter (Signed)
Pt called and stated on Friday and Saturday while walking had chest pain/pressure. Had it again today, while he was emptying the dishwasher.   Hasn't been bad enough to take any Nitro, but the pain/pressure goes away on its own.

## 2018-09-27 NOTE — Telephone Encounter (Signed)
Chest pain episodes with certain activity, but not with exercise. He has not hd to take NTG. I will set up a stress test at the earliest available. I have encouraged him to call [phone] if chest pain does not resolve with two NTG.  Elder Negus, MD [facility] [place] Pager: [phone] Office: [phone] If no answer Cell [phone]

## 2018-10-04 ENCOUNTER — Ambulatory Visit (INDEPENDENT_AMBULATORY_CARE_PROVIDER_SITE_OTHER): Payer: Medicare Other

## 2018-10-04 ENCOUNTER — Other Ambulatory Visit: Payer: Self-pay

## 2018-10-04 DIAGNOSIS — R0789 Other chest pain: Secondary | ICD-10-CM | POA: Diagnosis not present

## 2018-10-04 NOTE — Progress Notes (Signed)
Stress test looks excellent. No EKG changes or perfusion defects on imaging. Excellent exercise capacity. This is very reassuring. Unlikely that occasional chest pains are related to artery blockages.  Thanks MJP

## 2018-10-05 NOTE — Progress Notes (Signed)
Pt aware of results and pending appt.//ah

## 2018-10-15 ENCOUNTER — Telehealth: Payer: Self-pay

## 2018-10-21 ENCOUNTER — Other Ambulatory Visit: Payer: Self-pay | Admitting: Cardiology

## 2018-10-21 ENCOUNTER — Telehealth: Payer: Self-pay | Admitting: Cardiology

## 2018-10-21 ENCOUNTER — Inpatient Hospital Stay (HOSPITAL_COMMUNITY): Admission: RE | Admit: 2018-10-21 | Payer: Self-pay | Source: Ambulatory Visit

## 2018-10-21 DIAGNOSIS — I200 Unstable angina: Secondary | ICD-10-CM | POA: Insufficient documentation

## 2018-10-21 DIAGNOSIS — I10 Essential (primary) hypertension: Secondary | ICD-10-CM

## 2018-10-21 DIAGNOSIS — R079 Chest pain, unspecified: Secondary | ICD-10-CM

## 2018-10-21 MED ORDER — TICAGRELOR 90 MG PO TABS: 90.0000 mg | ORAL_TABLET | Freq: Two times a day (BID) | ORAL | 1 refills | Status: DC

## 2018-10-21 MED ORDER — ATORVASTATIN CALCIUM 80 MG PO TABS: 80.0000 mg | ORAL_TABLET | Freq: Every evening | ORAL | 1 refills | Status: DC

## 2018-10-21 MED ORDER — LOSARTAN POTASSIUM 50 MG PO TABS: 50.0000 mg | ORAL_TABLET | Freq: Every day | ORAL | 1 refills | Status: DC

## 2018-10-21 MED ORDER — METOPROLOL SUCCINATE ER 25 MG PO TB24: 25.0000 mg | ORAL_TABLET | Freq: Every day | ORAL | 1 refills | Status: DC

## 2018-10-21 NOTE — Telephone Encounter (Addendum)
73 year old [name] coronary artery disease, PCI to OM1 in 04/2018 in [place], PCI to large mid RCA for non-STEMI in 05/2018 by me.  He underwent exercise nuclear stress test 2 weeks ago with excellent exercise capacity, and no evidence of ischemia.  However, patient has been having recurrent episodes of exertional chest pressure radiating to jaw with strenuous exercise.  Symptoms are similar to his previous episodes prior to PCI. Episodes has occurred roughly 10 times in the last 3 weeks.  Patient reportedly had a normal stress test in [place] in December 2019 before having a non-STEMI and mid RCA few weeks later.  He is very concerned about his symptoms.  I offered him medical management with addition of Ranexa.  His blood pressure is low normal, thus precluding use of nitrate.  However, patient is planning to travel to [place] next week to be with his wife who lives there.  He requests if he could proceed with coronary angiography and intervention, if necessary. This is reasonable given the increased frequency of his symptoms and unstable nature of his angina symptoms.   I will schedule him for coronary angiogram on 10/26/2018.  Patient will undergo COVID testing prior to angiography.  Elder Negus, MD [facility] Pager: [phone] Office: [phone] If no answer Cell [phone]

## 2018-10-21 NOTE — Addendum Note (Signed)
Addended by: Elder Negus on: 10/21/2018 04:14 PM   Modules accepted: Orders

## 2018-10-22 ENCOUNTER — Other Ambulatory Visit (HOSPITAL_COMMUNITY): Payer: Self-pay | Admitting: Cardiology

## 2018-10-22 ENCOUNTER — Other Ambulatory Visit (HOSPITAL_COMMUNITY)
Admission: RE | Admit: 2018-10-22 | Discharge: 2018-10-22 | Disposition: A | Discharge status: Home / Self Care | Payer: Medicare Other | Source: Ambulatory Visit | Attending: Cardiology | Admitting: Cardiology

## 2018-10-22 DIAGNOSIS — Z1159 Encounter for screening for other viral diseases: Secondary | ICD-10-CM | POA: Diagnosis present

## 2018-10-23 LAB — BASIC METABOLIC PANEL
BUN/Creatinine Ratio: 12 (ref 10–24)
BUN: 12 mg/dL (ref 8–27)
CO2: 22 mmol/L (ref 20–29)
Calcium: 9.7 mg/dL (ref 8.6–10.2)
Chloride: 98 mmol/L (ref 96–106)
Creatinine, Ser: 0.99 mg/dL (ref 0.76–1.27)
GFR calc Af Amer: 88 mL/min/{1.73_m2} (ref >59)
GFR calc non Af Amer: 76 mL/min/{1.73_m2} (ref >59)
Glucose: 96 mg/dL (ref 65–99)
Potassium: 4.3 mmol/L (ref 3.5–5.2)
Sodium: 137 mmol/L (ref 134–144)

## 2018-10-23 LAB — CBC
Hematocrit: 40.4 % (ref 37.5–51.0)
Hemoglobin: 14.2 g/dL (ref 13.0–17.7)
MCH: 30.7 pg (ref 26.6–33.0)
MCHC: 35.1 g/dL (ref 31.5–35.7)
MCV: 87 fL (ref 79–97)
Platelets: 408 10*3/uL (ref 150–450)
RBC: 4.62 x10E6/uL (ref 4.14–5.80)
RDW: 12.8 % (ref 11.6–15.4)
WBC: 6.8 10*3/uL (ref 3.4–10.8)

## 2018-10-23 LAB — NOVEL CORONAVIRUS, NAA (HOSP ORDER, SEND-OUT TO REF LAB; TAT 18-24 HRS): SARS-CoV-2, NAA: NOT DETECTED

## 2018-10-25 NOTE — H&P (Signed)
Tracy Figueroa is an 73 y.o. male.   Chief Complaint: Chest pain HPI:   73 year old [name] coronary artery disease, PCI to OM1 in 04/2018 in [place], PCI to large mid RCA for non-STEMI in 05/2018 by me.  He underwent exercise nuclear stress test 2 weeks ago with excellent exercise capacity, and no evidence of ischemia.  However, patient has been having recurrent episodes of exertional chest pressure radiating to jaw with strenuous exercise.  Symptoms are similar to his previous episodes prior to PCI. Episodes has occurred roughly 10 times in the last 3 weeks.  Patient reportedly had a normal stress test in [place] in December 2019 before having a non-STEMI and mid RCA few weeks later.  He is very concerned about his symptoms.   Past Medical History:  Diagnosis Date  . Coronary artery disease   . High cholesterol   . Hypertension   . NSTEMI (non-ST elevated myocardial infarction) (HCC) 05/19/2018    Past Surgical History:  Procedure Laterality Date  . CATARACT EXTRACTION W/ INTRAOCULAR LENS  IMPLANT, BILATERAL Bilateral 2000s  . CORONARY ANGIOPLASTY WITH STENT PLACEMENT  04/12/2018; 05/20/2018   "in [place]; [facility]"  . CORONARY STENT INTERVENTION N/A 05/20/2018   Procedure: CORONARY STENT INTERVENTION;  Surgeon: Elder Negus, MD;  Location: [facility];  Service: Cardiovascular;  Laterality: N/A;  . FINGER FRACTURE SURGERY Left    "pinky"  . FRACTURE SURGERY    . INGUINAL HERNIA REPAIR  1958  . LEFT HEART CATH AND CORONARY ANGIOGRAPHY N/A 05/20/2018   Procedure: LEFT HEART CATH AND CORONARY ANGIOGRAPHY;  Surgeon: Elder Negus, MD;  Location: [facility];  Service: Cardiovascular;  Laterality: N/A;    No family history on file. Social History:  reports that he has never smoked. He has never used smokeless tobacco. He reports current alcohol use of about 9.0 standard drinks of alcohol per week. He reports previous drug use. Drug:  Marijuana.  Allergies:  Allergies  Allergen Reactions  . Amlodipine Swelling    Leg swelling    Review of Systems  Constitution: Negative for decreased appetite, malaise/fatigue, weight gain and weight loss.  HENT: Negative for congestion.   Eyes: Negative for visual disturbance.  Cardiovascular: Positive for chest pain. Negative for dyspnea on exertion, leg swelling, palpitations and syncope.  Respiratory: Negative for cough.   Endocrine: Negative for cold intolerance.  Hematologic/Lymphatic: Does not bruise/bleed easily.  Skin: Negative for itching and rash.  Musculoskeletal: Negative for myalgias.  Gastrointestinal: Negative for abdominal pain, nausea and vomiting.  Genitourinary: Negative for dysuria.  Neurological: Negative for dizziness and weakness.  Psychiatric/Behavioral: The patient is not nervous/anxious.   All other systems reviewed and are negative.    There were no vitals taken for this visit. There is no height or weight on file to calculate BMI.  Physical Exam  Constitutional: He is oriented to person, place, and time. He appears well-developed and well-nourished. No distress.  HENT:  Head: Normocephalic and atraumatic.  Eyes: Pupils are equal, round, and reactive to light. Conjunctivae are normal.  Neck: No JVD present.  Cardiovascular: Normal rate, regular rhythm and intact distal pulses.  Pulmonary/Chest: Effort normal and breath sounds normal. He has no wheezes. He has no rales.  Abdominal: Soft. Bowel sounds are normal. There is no rebound.  Musculoskeletal:        General: No edema.  Lymphadenopathy:    He has no cervical adenopathy.  Neurological: He is alert and oriented to  person, place, and time. No cranial nerve deficit.  Skin: Skin is warm and dry.  Psychiatric: He has a normal mood and affect.  Nursing note and vitals reviewed.   Labs:   Lab Results  Component Value Date   WBC 6.8 10/22/2018   HGB 14.2 10/22/2018   HCT 40.4 10/22/2018    MCV 87 10/22/2018   PLT 408 10/22/2018    Recent Labs  Lab 10/22/18 0926  NA 137  K 4.3  CL 98  CO2 22  BUN 12  CREATININE 0.99  CALCIUM 9.7  GLUCOSE 96    Lipid Panel     Component Value Date/Time   CHOL 137 08/23/2018 0753   TRIG 69 08/23/2018 0753   HDL 68 08/23/2018 0753   CHOLHDL 2.1 05/20/2018 1048   VLDL 9 05/20/2018 1048   LDLCALC 55 08/23/2018 0753    BNP (last 3 results) No results for input(s): BNP in the last 8760 hours.  HEMOGLOBIN A1C Lab Results  Component Value Date   HGBA1C 5.2 05/20/2018   MPG 102.54 05/20/2018    Cardiac Panel (last 3 results) Recent Labs    05/20/18 1206 08/30/18 1611  TROPONINI 0.13* 0.01    Lab Results  Component Value Date   TROPONINI 0.01 08/30/2018     TSH No results for input(s): TSH in the last 8760 hours.   No medications prior to admission.     No current facility-administered medications for this encounter.   Current Outpatient Medications:  .  Ascorbic Acid (VITAMIN C) 1000 MG tablet, Take 1,000 mg by mouth at bedtime. , Disp: , Rfl:  .  aspirin EC 81 MG tablet, Take 81 mg by mouth daily., Disp: , Rfl:  .  Multiple Vitamin (MULTIVITAMIN WITH MINERALS) TABS tablet, Take 1 tablet by mouth at bedtime. , Disp: , Rfl:  .  nitroGLYCERIN (NITROSTAT) 0.4 MG SL tablet, Place 0.4 mg under the tongue every 5 (five) minutes as needed for chest pain., Disp: , Rfl:  .  NON FORMULARY, Take 0.5 tablets by mouth daily. Diupress (Chlortalidone-Amiloride hydrochloride) 25-5 mg tablet (patient dose is 0.5 tablet), Disp: , Rfl:  .  sildenafil (VIAGRA) 50 MG tablet, Take 50 mg by mouth daily as needed for erectile dysfunction., Disp: , Rfl:  .  atorvastatin (LIPITOR) 80 MG tablet, Take 1 tablet (80 mg total) by mouth every evening., Disp: 180 tablet, Rfl: 1 .  losartan (COZAAR) 50 MG tablet, Take 1 tablet (50 mg total) by mouth daily., Disp: 180 tablet, Rfl: 1 .  metoprolol succinate (TOPROL-XL) 25 MG 24 hr tablet, Take  1 tablet (25 mg total) by mouth at bedtime., Disp: 180 tablet, Rfl: 1 .  ticagrelor (BRILINTA) 90 MG TABS tablet, Take 1 tablet (90 mg total) by mouth 2 (two) times daily., Disp: 360 tablet, Rfl: 1   Vitals pending   CARDIAC STUDIES:  Exercise Myoview stress test 10/04/2018: 1. The patient performed treadmill exercise using Bruce protocol, completing 10:45 minutes. The patient completed an estimated workload of 13 METS, reaching 102% of the maximum predicted heart rate. Exercise capacity was excellent. Hemodynamic response was normal. No stress symptoms reported. No ischemic changes seen on stress electrocardiogram.  2. The overall quality of the study is excellent. There is no evidence of abnormal lung activity. Stress and rest SPECT images demonstrate homogeneous tracer distribution throughout the myocardium. Gated SPECT imaging reveals normal myocardial thickening and wall motion. The left ventricular ejection fraction was normal (58%).   3. Low risk  study.   Coronary angiography 05/20/2018: LM: Normal LAD: Mid 20% disease LCx: Patent distal LCx stent RCA: Mid 99% stenosis         Successful PTCA and stent placement         Resolute Onyx 4.5 X 15 mm DES         0% residual stenosis with excellent apposition and expansion confirmed on IVUS.          Prox calcific 40% stenosis.  LVEPD Normal LVEF 45-50% with inferior hypokinesis.   Assessment/Plan  73 year old [place] American man coronary artery disease, PCI to OM1 in 04/2018 in [place], PCI to large mid RCA for non-STEMI in 05/2018 by me.  I offered him medical management with addition of Ranexa.  His blood pressure is low normal, thus precluding use of nitrate.  However, patient is planning to travel to [place] next week to be with his wife who lives there.  He requests if he could proceed with coronary angiography and intervention, if necessary. This is reasonable given the increased frequency of his symptoms and unstable nature  of his angina symptoms.   I will schedule him for coronary angiogram on 10/26/2018.  Patient will undergo COVID testing prior to angiography.   Elder NegusManish J Yissel Habermehl, MD 10/25/2018, 7:32 PM [facility] Pager: [phone] Office: [phone] If no answer: [phone]

## 2018-10-26 ENCOUNTER — Encounter (HOSPITAL_COMMUNITY): Payer: Self-pay | Admitting: Cardiology

## 2018-10-26 ENCOUNTER — Encounter (HOSPITAL_COMMUNITY)
Admission: RE | Disposition: A | Discharge status: Home / Self Care | Payer: Self-pay | Source: Home / Self Care | Attending: Cardiology

## 2018-10-26 ENCOUNTER — Other Ambulatory Visit: Payer: Self-pay

## 2018-10-26 ENCOUNTER — Ambulatory Visit (HOSPITAL_COMMUNITY)
Admission: RE | Admit: 2018-10-26 | Discharge: 2018-10-26 | Disposition: A | Discharge status: Home / Self Care | Payer: Medicare Other | Attending: Cardiology | Admitting: Cardiology

## 2018-10-26 DIAGNOSIS — Z79899 Other long term (current) drug therapy: Secondary | ICD-10-CM | POA: Insufficient documentation

## 2018-10-26 DIAGNOSIS — Z888 Allergy status to other drugs, medicaments and biological substances status: Secondary | ICD-10-CM | POA: Diagnosis not present

## 2018-10-26 DIAGNOSIS — Z955 Presence of coronary angioplasty implant and graft: Secondary | ICD-10-CM | POA: Insufficient documentation

## 2018-10-26 DIAGNOSIS — I2584 Coronary atherosclerosis due to calcified coronary lesion: Secondary | ICD-10-CM | POA: Insufficient documentation

## 2018-10-26 DIAGNOSIS — I252 Old myocardial infarction: Secondary | ICD-10-CM | POA: Diagnosis not present

## 2018-10-26 DIAGNOSIS — Z7982 Long term (current) use of aspirin: Secondary | ICD-10-CM | POA: Diagnosis not present

## 2018-10-26 DIAGNOSIS — I25110 Atherosclerotic heart disease of native coronary artery with unstable angina pectoris: Secondary | ICD-10-CM | POA: Diagnosis not present

## 2018-10-26 DIAGNOSIS — I10 Essential (primary) hypertension: Secondary | ICD-10-CM | POA: Insufficient documentation

## 2018-10-26 DIAGNOSIS — I200 Unstable angina: Secondary | ICD-10-CM | POA: Diagnosis not present

## 2018-10-26 DIAGNOSIS — E7800 Pure hypercholesterolemia, unspecified: Secondary | ICD-10-CM | POA: Insufficient documentation

## 2018-10-26 HISTORY — PX: LEFT HEART CATH AND CORONARY ANGIOGRAPHY: CATH118249

## 2018-10-26 SURGERY — LEFT HEART CATH AND CORONARY ANGIOGRAPHY
Anesthesia: LOCAL

## 2018-10-26 MED ORDER — NITROGLYCERIN 1 MG/10 ML FOR IR/CATH LAB
INTRA_ARTERIAL | Status: DC | PRN
  Administered 2018-10-26: 50 ug

## 2018-10-26 MED ORDER — SILDENAFIL CITRATE 50 MG PO TABS: 50.0000 mg | ORAL_TABLET | Freq: Every day | ORAL | 0 refills | Status: DC | PRN

## 2018-10-26 MED ORDER — SODIUM CHLORIDE 0.9% FLUSH: 3.0000 mL | Freq: Two times a day (BID) | INTRAVENOUS | Status: DC

## 2018-10-26 MED ORDER — HEPARIN (PORCINE) IN NACL 1000-0.9 UT/500ML-% IV SOLN
INTRAVENOUS | Status: DC | PRN
  Administered 2018-10-26 (×2): 500 mL

## 2018-10-26 MED ORDER — VERAPAMIL HCL 2.5 MG/ML IV SOLN
INTRAVENOUS | Status: AC
  Filled 2018-10-26: qty 2

## 2018-10-26 MED ORDER — HEPARIN SODIUM (PORCINE) 1000 UNIT/ML IJ SOLN
INTRAMUSCULAR | Status: DC | PRN
  Administered 2018-10-26: 3500 [IU] via INTRAVENOUS

## 2018-10-26 MED ORDER — RANOLAZINE ER 500 MG PO TB12: 500.0000 mg | ORAL_TABLET | Freq: Two times a day (BID) | ORAL | 1 refills | Status: DC

## 2018-10-26 MED ORDER — ASPIRIN 81 MG PO CHEW: 81.0000 mg | CHEWABLE_TABLET | ORAL | Status: DC

## 2018-10-26 MED ORDER — SODIUM CHLORIDE 0.9 % IV SOLN: INTRAVENOUS | Status: AC

## 2018-10-26 MED ORDER — SODIUM CHLORIDE 0.9 % IV SOLN: 250.0000 mL | INTRAVENOUS | Status: DC | PRN

## 2018-10-26 MED ORDER — NITROGLYCERIN 0.4 MG SL SUBL
SUBLINGUAL_TABLET | SUBLINGUAL | Status: DC | PRN
  Administered 2018-10-26: .4 mg via SUBLINGUAL

## 2018-10-26 MED ORDER — MIDAZOLAM HCL 2 MG/2ML IJ SOLN
INTRAMUSCULAR | Status: AC
  Filled 2018-10-26: qty 2

## 2018-10-26 MED ORDER — ACETAMINOPHEN 325 MG PO TABS: 650.0000 mg | ORAL_TABLET | ORAL | Status: DC | PRN

## 2018-10-26 MED ORDER — SODIUM CHLORIDE 0.9% FLUSH: 3.0000 mL | INTRAVENOUS | Status: DC | PRN

## 2018-10-26 MED ORDER — IOHEXOL 350 MG/ML SOLN
INTRAVENOUS | Status: DC | PRN
  Administered 2018-10-26: 65 mL via INTRA_ARTERIAL

## 2018-10-26 MED ORDER — LABETALOL HCL 5 MG/ML IV SOLN: 10.0000 mg | INTRAVENOUS | Status: DC | PRN

## 2018-10-26 MED ORDER — SODIUM CHLORIDE 0.9 % WEIGHT BASED INFUSION
3.0000 mL/kg/h | INTRAVENOUS | Status: AC
  Administered 2018-10-26: 3 mL/kg/h via INTRAVENOUS

## 2018-10-26 MED ORDER — LIDOCAINE HCL (PF) 1 % IJ SOLN
INTRAMUSCULAR | Status: DC | PRN
  Administered 2018-10-26: 2 mL

## 2018-10-26 MED ORDER — NITROGLYCERIN 1 MG/10 ML FOR IR/CATH LAB
INTRA_ARTERIAL | Status: AC
  Filled 2018-10-26: qty 10

## 2018-10-26 MED ORDER — VERAPAMIL HCL 2.5 MG/ML IV SOLN
INTRAVENOUS | Status: DC | PRN
  Administered 2018-10-26: 08:00:00 via INTRA_ARTERIAL

## 2018-10-26 MED ORDER — ONDANSETRON HCL 4 MG/2ML IJ SOLN: 4.0000 mg | Freq: Four times a day (QID) | INTRAMUSCULAR | Status: DC | PRN

## 2018-10-26 MED ORDER — NITROGLYCERIN 0.4 MG SL SUBL
SUBLINGUAL_TABLET | SUBLINGUAL | Status: AC
  Filled 2018-10-26: qty 1

## 2018-10-26 MED ORDER — FENTANYL CITRATE (PF) 100 MCG/2ML IJ SOLN
INTRAMUSCULAR | Status: AC
  Filled 2018-10-26: qty 2

## 2018-10-26 MED ORDER — SODIUM CHLORIDE 0.9 % WEIGHT BASED INFUSION
1.0000 mL/kg/h | INTRAVENOUS | Status: DC
  Administered 2018-10-26 (×2): 500 mL via INTRAVENOUS

## 2018-10-26 MED ORDER — HEPARIN SODIUM (PORCINE) 1000 UNIT/ML IJ SOLN
INTRAMUSCULAR | Status: AC
  Filled 2018-10-26: qty 1

## 2018-10-26 MED ORDER — MIDAZOLAM HCL 2 MG/2ML IJ SOLN
INTRAMUSCULAR | Status: DC | PRN
  Administered 2018-10-26: 1 mg via INTRAVENOUS

## 2018-10-26 MED ORDER — HEPARIN (PORCINE) IN NACL 1000-0.9 UT/500ML-% IV SOLN
INTRAVENOUS | Status: AC
  Filled 2018-10-26: qty 1000

## 2018-10-26 MED ORDER — LIDOCAINE HCL (PF) 1 % IJ SOLN
INTRAMUSCULAR | Status: AC
  Filled 2018-10-26: qty 30

## 2018-10-26 MED ORDER — HYDRALAZINE HCL 20 MG/ML IJ SOLN: 10.0000 mg | INTRAMUSCULAR | Status: DC | PRN

## 2018-10-26 MED ORDER — FENTANYL CITRATE (PF) 100 MCG/2ML IJ SOLN
INTRAMUSCULAR | Status: DC | PRN
  Administered 2018-10-26: 25 ug via INTRAVENOUS

## 2018-10-26 SURGICAL SUPPLY — 15 items
CATH INFINITI 5 FR JL3.5 (CATHETERS) IMPLANT
CATH INFINITI JR4 5F (CATHETERS) ×1 IMPLANT
CATH OPTITORQUE TIG 4.0 5F (CATHETERS) ×1 IMPLANT
COVER DOME SNAP 22 D (MISCELLANEOUS) ×1 IMPLANT
DEVICE RAD COMP TR BAND LRG (VASCULAR PRODUCTS) ×1 IMPLANT
GLIDESHEATH SLEND A-KIT 6F 22G (SHEATH) ×1 IMPLANT
GLIDESHEATH SLEND SS 6F .021 (SHEATH) ×1 IMPLANT
GUIDEWIRE INQWIRE 1.5J.035X260 (WIRE) IMPLANT
INQWIRE 1.5J .035X260CM (WIRE) ×2
KIT HEART LEFT (KITS) ×2 IMPLANT
PACK CARDIAC CATHETERIZATION (CUSTOM PROCEDURE TRAY) ×2 IMPLANT
SHEATH PROBE COVER 6X72 (BAG) ×1 IMPLANT
TRANSDUCER W/STOPCOCK (MISCELLANEOUS) ×2 IMPLANT
TUBING CIL FLEX 10 FLL-RA (TUBING) ×2 IMPLANT
WIRE HI TORQ VERSACORE-J 145CM (WIRE) ×1 IMPLANT

## 2018-10-26 NOTE — Progress Notes (Signed)
Attempted to remove air from TRB/Immediate hematoma under band/ air replaced/will re attempt in thirty minutes

## 2018-10-26 NOTE — Discharge Instructions (Signed)
Radial Site Care ° °This sheet gives you information about how to care for yourself after your procedure. Your health care provider may also give you more specific instructions. If you have problems or questions, contact your health care provider. °What can I expect after the procedure? °After the procedure, it is common to have: °· Bruising and tenderness at the catheter insertion area. °Follow these instructions at home: °Medicines °· Take over-the-counter and prescription medicines only as told by your health care provider. °Insertion site care °· Follow instructions from your health care provider about how to take care of your insertion site. Make sure you: °? Wash your hands with soap and water before you change your bandage (dressing). If soap and water are not available, use hand sanitizer. °? Change your dressing as told by your health care provider. °? Leave stitches (sutures), skin glue, or adhesive strips in place. These skin closures may need to stay in place for 2 weeks or longer. If adhesive strip edges start to loosen and curl up, you may trim the loose edges. Do not remove adhesive strips completely unless your health care provider tells you to do that. °· Check your insertion site every day for signs of infection. Check for: °? Redness, swelling, or pain. °? Fluid or blood. °? Pus or a bad smell. °? Warmth. °· Do not take baths, swim, or use a hot tub until your health care provider approves. °· You may shower 24-48 hours after the procedure, or as directed by your health care provider. °? Remove the dressing and gently wash the site with plain soap and water. °? Pat the area dry with a clean towel. °? Do not rub the site. That could cause bleeding. °· Do not apply powder or lotion to the site. °Activity ° °· For 24 hours after the procedure, or as directed by your health care provider: °? Do not flex or bend the affected arm. °? Do not push or pull heavy objects with the affected arm. °? Do not  drive yourself home from the hospital or clinic. You may drive 24 hours after the procedure unless your health care provider tells you not to. °? Do not operate machinery or power tools. °· Do not lift anything that is heavier than 10 lb (4.5 kg), or the limit that you are told, until your health care provider says that it is safe. °· Ask your health care provider when it is okay to: °? Return to work or school. °? Resume usual physical activities or sports. °? Resume sexual activity. °General instructions °· If the catheter site starts to bleed, raise your arm and put firm pressure on the site. If the bleeding does not stop, get help right away. This is a medical emergency. °· If you went home on the same day as your procedure, a responsible adult should be with you for the first 24 hours after you arrive home. °· Keep all follow-up visits as told by your health care provider. This is important. °Contact a health care provider if: °· You have a fever. °· You have redness, swelling, or yellow drainage around your insertion site. °Get help right away if: °· You have unusual pain at the radial site. °· The catheter insertion area swells very fast. °· The insertion area is bleeding, and the bleeding does not stop when you hold steady pressure on the area. °· Your arm or hand becomes pale, cool, tingly, or numb. °These symptoms may represent a serious problem   that is an emergency. Do not wait to see if the symptoms will go away. Get medical help right away. Call your local emergency services (911 in the [place] Do not drive yourself to the hospital. °Summary °· After the procedure, it is common to have bruising and tenderness at the site. °· Follow instructions from your health care provider about how to take care of your radial site wound. Check the wound every day for signs of infection. °· Do not lift anything that is heavier than 10 lb (4.5 kg), or the limit that you are told, until your health care provider says  that it is safe. °This information is not intended to replace advice given to you by your health care provider. Make sure you discuss any questions you have with your health care provider. °Document Released: 06/21/2010 Document Revised: 06/24/2017 Document Reviewed: 06/24/2017 °Elsevier Interactive Patient Education © 2019 [employer] ° °

## 2018-10-26 NOTE — Interval H&P Note (Signed)
History and Physical Interval Note:  10/26/2018 7:28 AM  Tracy Figueroa  has presented today for surgery, with the diagnosis of positive stress test.  The various methods of treatment have been discussed with the patient and family. After consideration of risks, benefits and other options for treatment, the patient has consented to  Procedure(s): LEFT HEART CATH AND CORONARY ANGIOGRAPHY (N/A) as a surgical intervention.  The patient's history has been reviewed, patient examined, no change in status, stable for surgery.  I have reviewed the patient's chart and labs.  Questions were answered to the patient's satisfaction.     [facility] webpage not available Indication: Unstable angina  Erabella Kuipers J Mayan Dolney

## 2018-10-27 ENCOUNTER — Other Ambulatory Visit: Payer: Self-pay | Admitting: Cardiology

## 2018-10-27 NOTE — Telephone Encounter (Signed)
Please fill

## 2018-10-29 ENCOUNTER — Ambulatory Visit (INDEPENDENT_AMBULATORY_CARE_PROVIDER_SITE_OTHER): Payer: Medicare Other | Admitting: Cardiology

## 2018-10-29 ENCOUNTER — Other Ambulatory Visit: Payer: Self-pay

## 2018-10-29 ENCOUNTER — Encounter: Payer: Self-pay | Admitting: Cardiology

## 2018-10-29 VITALS — BP 120/65

## 2018-10-29 DIAGNOSIS — I25118 Atherosclerotic heart disease of native coronary artery with other forms of angina pectoris: Secondary | ICD-10-CM

## 2018-10-29 DIAGNOSIS — I10 Essential (primary) hypertension: Secondary | ICD-10-CM | POA: Insufficient documentation

## 2018-10-29 MED ORDER — NITROGLYCERIN 0.4 MG SL SUBL: 0.4000 mg | SUBLINGUAL_TABLET | SUBLINGUAL | 2 refills | Status: DC | PRN

## 2018-10-29 MED ORDER — AMLODIPINE BESYLATE 2.5 MG PO TABS: 2.5000 mg | ORAL_TABLET | Freq: Every day | ORAL | 1 refills | Status: DC

## 2018-10-29 NOTE — Progress Notes (Signed)
Follow up visit  Subjective:   Tracy SnowJonathan Richard Figueroa, male    DOB: [birth year], 73 y.o.   MRN: [id number]   I connected with the patient on 10/29/2018 by a video enabled telemedicine application and verified that I am speaking with the correct person using two identifiers.     I discussed the limitations of evaluation and management by telemedicine and the availability of in person appointments. The patient expressed understanding and agreed to proceed.   This visit type was conducted due to national recommendations for restrictions regarding the COVID-19 Pandemic (e.g. social distancing).  This format is felt to be most appropriate for this patient at this time.  All issues noted in this document were discussed and addressed.  No physical exam was performed (except for noted visual exam findings with Tele health visits).  The patient has consented to conduct a Tele health visit and understands insurance will be billed.   Chief Complaint  Patient presents with  . Coronary Artery Disease     HPI  73 year old [place] man coronary artery disease, PCI to OM1 and residual 04/2018, PCI to large mid RCA for non-STEMI in 05/2018 by me.  In spite of normal stress test, patient continued to have almost daily episodes of chest pain. Thus, he underwent coronary angiography on 05/26 that showed patent stents, otherwise mild nonobstructive CAD. Patient was started on Ranexa. He has continued to have chest pain and jaw pain episodes lasting for 20-30 sec at peak pf physical activity.   Patients wife lives in [place]. He is leaving next week, and is unlikely to return to [place] for [place].   Past Medical History:  Diagnosis Date  . Coronary artery disease   . High cholesterol   . Hypertension   . NSTEMI (non-ST elevated myocardial infarction) (HCC) 05/19/2018     Past Surgical History:  Procedure Laterality Date  . CATARACT EXTRACTION W/ INTRAOCULAR LENS  IMPLANT,  BILATERAL Bilateral 2000s  . CORONARY ANGIOPLASTY WITH STENT PLACEMENT  04/12/2018; 05/20/2018   "in [place]; [facility]"  . CORONARY STENT INTERVENTION N/A 05/20/2018   Procedure: CORONARY STENT INTERVENTION;  Surgeon: Elder NegusPatwardhan, Steven Veazie J, MD;  Location: [facility];  Service: Cardiovascular;  Laterality: N/A;  . FINGER FRACTURE SURGERY Left    "pinky"  . FRACTURE SURGERY    . INGUINAL HERNIA REPAIR  1958  . LEFT HEART CATH AND CORONARY ANGIOGRAPHY N/A 05/20/2018   Procedure: LEFT HEART CATH AND CORONARY ANGIOGRAPHY;  Surgeon: Elder NegusPatwardhan, Karyna Bessler J, MD;  Location: [facility];  Service: Cardiovascular;  Laterality: N/A;  . LEFT HEART CATH AND CORONARY ANGIOGRAPHY N/A 10/26/2018   Procedure: LEFT HEART CATH AND CORONARY ANGIOGRAPHY;  Surgeon: Elder NegusPatwardhan, Aiman Sonn J, MD;  Location: [facility];  Service: Cardiovascular;  Laterality: N/A;     Social History   Socioeconomic History  . Marital status: Married    Spouse name: Not on file  . Number of children: 0  . Years of education: Not on file  . Highest education level: Not on file  Occupational History  . Not on file  Social Needs  . Financial resource strain: Not on file  . Food insecurity:    Worry: Not on file    Inability: Not on file  . Transportation needs:    Medical: Not on file    Non-medical: Not on file  Tobacco Use  . Smoking status: Never Smoker  . Smokeless tobacco: Never Used  Substance and  Sexual Activity  . Alcohol use: Yes    Alcohol/week: 9.0 standard drinks    Types: 9 Glasses of wine per week  . Drug use: Not Currently    Types: Marijuana    Comment: 05/20/2018 "last weed was 1970"  . Sexual activity: Not on file  Lifestyle  . Physical activity:    Days per week: Not on file    Minutes per session: Not on file  . Stress: Not on file  Relationships  . Social connections:    Talks on phone: Not on file    Gets together: Not on file    Attends religious service: Not on file    Active  member of club or organization: Not on file    Attends meetings of clubs or organizations: Not on file    Relationship status: Not on file  . Intimate partner violence:    Fear of current or ex partner: Not on file    Emotionally abused: Not on file    Physically abused: Not on file    Forced sexual activity: Not on file  Other Topics Concern  . Not on file  Social History Narrative  . Not on file     Current Outpatient Medications on File Prior to Visit  Medication Sig Dispense Refill  . Ascorbic Acid (VITAMIN C) 1000 MG tablet Take 1,000 mg by mouth at bedtime.     Marland Kitchen aspirin EC 81 MG tablet Take 81 mg by mouth daily.    Marland Kitchen atorvastatin (LIPITOR) 80 MG tablet Take 1 tablet (80 mg total) by mouth every evening. 180 tablet 1  . losartan (COZAAR) 50 MG tablet Take 1 tablet (50 mg total) by mouth daily. 180 tablet 1  . metoprolol succinate (TOPROL-XL) 25 MG 24 hr tablet Take 1 tablet (25 mg total) by mouth at bedtime. 180 tablet 1  . Multiple Vitamin (MULTIVITAMIN WITH MINERALS) TABS tablet Take 1 tablet by mouth at bedtime.     . NON FORMULARY Take 0.5 tablets by mouth daily. Diupress (Chlortalidone-Amiloride hydrochloride) 25-5 mg tablet (patient dose is 0.5 tablet)    . ticagrelor (BRILINTA) 90 MG TABS tablet Take 1 tablet (90 mg total) by mouth 2 (two) times daily. 360 tablet 1  . ranolazine (RANEXA) 500 MG 12 hr tablet Take 1 tablet (500 mg total) by mouth 2 (two) times daily. 360 tablet 1  . sildenafil (VIAGRA) 50 MG tablet Take 1 tablet (50 mg total) by mouth daily as needed for erectile dysfunction. Do not take 48 hrs either side of using nitroglycerin 10 tablet 0   No current facility-administered medications on file prior to visit.     Cardiovascular studies:  Coronary angiography 10/26/2018: LM: Normal LAD: Mid 20% disease LCx: Patent distal LCx stent. 20% prox LCx stenosis RCA: Prox calcific 40% stenosis. Widely patent mid RCA stent Normal LVEDP  Impression: Patent  LCx and RCA stents. Otherwise mild nonobstructive CAD, unchanged compared to precious study in 05/2018.  Exercise Myoview stress test 10/04/2018: 1. The patient performed treadmill exercise using Bruce protocol, completing 10:45 minutes. The patient completed an estimated workload of 13 METS, reaching 102% of the maximum predicted heart rate. Exercise capacity was excellent. Hemodynamic response was normal. No stress symptoms reported. No ischemic changes seen on stress electrocardiogram.  2. The overall quality of the study is excellent. There is no evidence of abnormal lung activity. Stress and rest SPECT images demonstrate homogeneous tracer distribution throughout the myocardium. Gated SPECT imaging reveals normal myocardial thickening  and wall motion. The left ventricular ejection fraction was normal (58%).   3. Low risk study.      EKG 08/30/2018: Sinus rhythm 55 bpm. Normal axis. Normal conduction. Nonspoecific ST-T changes inferolateral leads. Inferior T wave inversion from 05/2019 EKG no longer seen.  Coronary angiogram 05/2018: LM: Normal LAD: Mid 20% disease LCx: Patent distal LCx stent RCA: Mid 99% stenosis Successful PTCA and stent placement Resolute Onyx 4.5 X 15 mm DES 0% residual stenosis with excellent apposition and expansion confirmed on IVUS.  Prox calcific 40% stenosis.  LVEPD Normal LVEF 45-50% with inferior hypokinesis. Recent labs: Results for ALEX, MENDIOLA (MRN [id number]) as of 08/26/2018 13:36  Ref. Range 08/23/2018 07:53  Cholesterol, Total Latest Ref Range: 100 - 199 mg/dL 334  HDL Cholesterol Latest Ref Range: >39 mg/dL 68  LDL (calc) Latest Ref Range: 0 - 99 mg/dL 55  Triglycerides Latest Ref Range: 0 - 149 mg/dL 69  VLDL Cholesterol Cal Latest Ref Range: 5 - 40 mg/dL 14   Results for THEADORE, POIST (MRN [id number]) as of 08/26/2018 13:36  Ref. Range 05/20/2018 10:48  Total CHOL/HDL Ratio Latest  Units: RATIO 2.1  Cholesterol Latest Ref Range: 0 - 200 mg/dL 729  HDL Cholesterol Latest Ref Range: >40 mg/dL 63  LDL (calc) Latest Ref Range: 0 - 99 mg/dL 61  Triglycerides Latest Ref Range: <150 mg/dL 46  VLDL Latest Ref Range: 0 - 40 mg/dL 9    Review of Systems  Constitution: Negative for decreased appetite, malaise/fatigue, weight gain and weight loss.  HENT: Negative for congestion.   Eyes: Negative for visual disturbance.  Cardiovascular: Positive for chest pain (Two episode this morning). Negative for dyspnea on exertion, leg swelling, palpitations and syncope.  Respiratory: Negative for shortness of breath.   Endocrine: Negative for cold intolerance.  Hematologic/Lymphatic: Does not bruise/bleed easily.  Skin: Negative for itching and rash.  Musculoskeletal: Negative for myalgias.  Gastrointestinal: Negative for abdominal pain, nausea and vomiting.  Genitourinary: Negative for dysuria.  Neurological: Negative for dizziness and weakness.  Psychiatric/Behavioral: The patient is not nervous/anxious.   All other systems reviewed and are negative.        Vitals:   10/29/18 1037  BP: 120/65    Objective:   Physical Exam  Constitutional: He is oriented to person, place, and time. He appears well-developed and well-nourished. No distress.  Pulmonary/Chest: Effort normal.  Neurological: He is alert and oriented to person, place, and time.  Psychiatric: He has a normal mood and affect.  Nursing note and vitals reviewed.         Assessment & Recommendations:   73 year old [name] coronary artery disease, PCI to OM1 and residual 04/2018, PCI to large mid RCA for non-STEMI in 05/2018 by me.  1. Coronary artery disease of native artery of native heart with stable angina pectoris (HCC) S/p Lcx PCI in [place] for unstable angina 04/2018, mid RCA PCI for NSTEMI 05/2018 by me. Stress test with excellent exercise capacity with no ischemia 10/2018. Given  persistent episodes of chest pain concerning for unstable angina, he underwent coronary angiography in 10/2018 showing widely patent stents and otherwise mild nonobstructive CAD. I suspect his angina episodes are microvascular in nature.  Given his use of PDE inhibitor, I would like to avoid nitrates. I have started him on Ranexa 500 mg twice daily.  I have also stopped his Diupres, and started on low-dose amlodipine 2.5 mg daily. Continue dual antiplatelet therapy with aspirin and Brilinta at  least till 05/2019. Continue Lipitor 80 mg daily. I have given him refills to last for next 6 months, as he may be away in [place].  I will see him for a virtual visit in August 2020.   Elder Negus, MD [facility] Pager: [phone] Office: [phone] If no answer Cell [phone]

## 2018-12-10 ENCOUNTER — Telehealth: Payer: Self-pay

## 2018-12-10 NOTE — Telephone Encounter (Signed)
No he is back

## 2018-12-10 NOTE — Telephone Encounter (Signed)
Pt called and said that he has swelling in his feet and ankles and his skin is very tight

## 2018-12-10 NOTE — Telephone Encounter (Signed)
Possibly related to amlodipine. Please ask him to hold amlodipine for next 3-4 days and see if swelling improves. Is he still in [place]?  Thanks MJP

## 2018-12-10 NOTE — Telephone Encounter (Signed)
Per MP hold Amlodipine for 3-5 days and let us know. , left message for pt on VM

## 2018-12-10 NOTE — Telephone Encounter (Signed)
Okay. Please follow up next week if symptoms improve.  Thanks MJP

## 2018-12-22 ENCOUNTER — Encounter: Payer: Self-pay | Admitting: Cardiology

## 2018-12-22 ENCOUNTER — Ambulatory Visit (INDEPENDENT_AMBULATORY_CARE_PROVIDER_SITE_OTHER): Payer: Medicare Other | Admitting: Cardiology

## 2018-12-22 ENCOUNTER — Other Ambulatory Visit: Payer: Self-pay

## 2018-12-22 VITALS — BP 130/85 | HR 65 | Wt 140.0 lb

## 2018-12-22 DIAGNOSIS — I2510 Atherosclerotic heart disease of native coronary artery without angina pectoris: Secondary | ICD-10-CM | POA: Diagnosis not present

## 2018-12-22 DIAGNOSIS — I10 Essential (primary) hypertension: Secondary | ICD-10-CM | POA: Diagnosis not present

## 2018-12-22 MED ORDER — HYDROCHLOROTHIAZIDE 12.5 MG PO CAPS: 12.5000 mg | ORAL_CAPSULE | Freq: Every day | ORAL | 3 refills | Status: DC

## 2018-12-22 NOTE — Progress Notes (Signed)
Follow up visit  Subjective:   Tracy Figueroa, male    DOB: 1946-05-08, 73 y.o.   MRN: [id number]   I connected with the patient on 12/22/2018 by a video enabled telemedicine application and verified that I am speaking with the correct person using two identifiers.     I discussed the limitations of evaluation and management by telemedicine and the availability of in person appointments. The patient expressed understanding and agreed to proceed.   This visit type was conducted due to national recommendations for restrictions regarding the COVID-19 Pandemic (e.g. social distancing).  This format is felt to be most appropriate for this patient at this time.  All issues noted in this document were discussed and addressed.  No physical exam was performed (except for noted visual exam findings with Tele health visits).  The patient has consented to conduct a Tele health visit and understands insurance will be billed.   Chief Complaint  Patient presents with  . Edema  . Hypertension     HPI   73 year old [place] man coronary artery disease, PCI to OM1 and residual 04/2018, PCI to large mid RCA for non-STEMI in 05/2018 by me.  Since stopping Diupres, and starting amlodipine, patient has started to notice ankle edema mostly towards the end of the day.  He has been tracking his blood pressure regularly.  He has noticed an upward trend with average blood pressure now ranging around 140/80 mmHg.  He denies any chest pain episodes.  He has been going between [place] and here to be with his wife.  He is taking necessary precautionary measures and has not had any COVID symptoms.   Past Medical History:  Diagnosis Date  . Coronary artery disease   . High cholesterol   . Hypertension   . NSTEMI (non-ST elevated myocardial infarction) ([place]) 05/19/2018     Past Surgical History:  Procedure Laterality Date  . CATARACT EXTRACTION W/ INTRAOCULAR LENS  IMPLANT, BILATERAL Bilateral  2000s  . CORONARY ANGIOPLASTY WITH STENT PLACEMENT  04/12/2018; 05/20/2018   "in [place]; MCHC"  . CORONARY STENT INTERVENTION N/A 05/20/2018   Procedure: CORONARY STENT INTERVENTION;  Surgeon: Nigel Mormon, MD;  Location: [facility];  Service: Cardiovascular;  Laterality: N/A;  . FINGER FRACTURE SURGERY Left    "pinky"  . FRACTURE SURGERY    . [place]  . LEFT HEART CATH AND CORONARY ANGIOGRAPHY N/A 05/20/2018   Procedure: LEFT HEART CATH AND CORONARY ANGIOGRAPHY;  Surgeon: Nigel Mormon, MD;  Location: [place] [facility];  Service: Cardiovascular;  Laterality: N/A;  . LEFT HEART CATH AND CORONARY ANGIOGRAPHY N/A 10/26/2018   Procedure: LEFT HEART CATH AND CORONARY ANGIOGRAPHY;  Surgeon: Nigel Mormon, MD;  Location: [facility];  Service: Cardiovascular;  Laterality: N/A;     Social History   Socioeconomic History  . Marital status: Married    Spouse name: Not on file  . Number of children: 0  . Years of education: Not on file  . Highest education level: Not on file  Occupational History  . Not on file  Social Needs  . Financial resource strain: Not on file  . Food insecurity    Worry: Not on file    Inability: Not on file  . Transportation needs    Medical: Not on file    Non-medical: Not on file  Tobacco Use  . Smoking status: Never Smoker  . Smokeless tobacco: Never Used  Substance and Sexual Activity  . Alcohol use: Yes    Alcohol/week: 9.0 standard drinks    Types: 9 Glasses of wine per week  . Drug use: Not Currently    Types: Marijuana    Comment: 05/20/2018 "last weed was 1970"  . Sexual activity: Not on file  Lifestyle  . Physical activity    Days per week: Not on file    Minutes per session: Not on file  . Stress: Not on file  Relationships  . Social Musicianconnections    Talks on phone: Not on file    Gets together: Not on file    Attends religious service: Not on file    Active member of club or  organization: Not on file    Attends meetings of clubs or organizations: Not on file    Relationship status: Not on file  . Intimate partner violence    Fear of current or ex partner: Not on file    Emotionally abused: Not on file    Physically abused: Not on file    Forced sexual activity: Not on file  Other Topics Concern  . Not on file  Social History Narrative  . Not on file     Current Outpatient Medications on File Prior to Visit  Medication Sig Dispense Refill  . amLODipine (NORVASC) 2.5 MG tablet Take 1 tablet (2.5 mg total) by mouth daily. 270 tablet 1  . Ascorbic Acid (VITAMIN C) 1000 MG tablet Take 1,000 mg by mouth at bedtime.     Marland Kitchen. aspirin EC 81 MG tablet Take 81 mg by mouth daily.    Marland Kitchen. atorvastatin (LIPITOR) 80 MG tablet Take 1 tablet (80 mg total) by mouth every evening. 180 tablet 1  . losartan (COZAAR) 50 MG tablet Take 1 tablet (50 mg total) by mouth daily. 180 tablet 1  . metoprolol succinate (TOPROL-XL) 25 MG 24 hr tablet Take 1 tablet (25 mg total) by mouth at bedtime. 180 tablet 1  . Multiple Vitamin (MULTIVITAMIN WITH MINERALS) TABS tablet Take 1 tablet by mouth at bedtime.     . nitroGLYCERIN (NITROSTAT) 0.4 MG SL tablet Place 1 tablet (0.4 mg total) under the tongue every 5 (five) minutes as needed for chest pain. 60 tablet 2  . ranolazine (RANEXA) 500 MG 12 hr tablet Take 1 tablet (500 mg total) by mouth 2 (two) times daily. 360 tablet 1  . sildenafil (VIAGRA) 50 MG tablet Take 1 tablet (50 mg total) by mouth daily as needed for erectile dysfunction. Do not take 48 hrs either side of using nitroglycerin 10 tablet 0  . ticagrelor (BRILINTA) 90 MG TABS tablet Take 1 tablet (90 mg total) by mouth 2 (two) times daily. 360 tablet 1   No current facility-administered medications on file prior to visit.     Cardiovascular studies:  Coronary angiography 10/26/2018: LM: Normal LAD: Mid 20% disease LCx: Patent distal LCx stent. 20% prox LCx stenosis RCA: Prox  calcific 40% stenosis. Widely patent mid RCA stent Normal LVEDP  Impression: Patent LCx and RCA stents. Otherwise mild nonobstructive CAD, unchanged compared to precious study in 05/2018.  Exercise Myoview stress test 10/04/2018: 1. The patient performed treadmill exercise using Bruce protocol, completing 10:45 minutes. The patient completed an estimated workload of 13 METS, reaching 102% of the maximum predicted heart rate. Exercise capacity was excellent. Hemodynamic response was normal. No stress symptoms reported. No ischemic changes seen on stress electrocardiogram.  2. The overall quality of the study is excellent.  There is no evidence of abnormal lung activity. Stress and rest SPECT images demonstrate homogeneous tracer distribution throughout the myocardium. Gated SPECT imaging reveals normal myocardial thickening and wall motion. The left ventricular ejection fraction was normal (58%).   3. Low risk study.      EKG 08/30/2018: Sinus rhythm 55 bpm. Normal axis. Normal conduction. Nonspoecific ST-T changes inferolateral leads. Inferior T wave inversion from 05/2019 EKG no longer seen.  Coronary angiogram 05/2018: LM: Normal LAD: Mid 20% disease LCx: Patent distal LCx stent RCA: Mid 99% stenosis Successful PTCA and stent placement Resolute Onyx 4.5 X 15 mm DES 0% residual stenosis with excellent apposition and expansion confirmed on IVUS.  Prox calcific 40% stenosis.  LVEPD Normal LVEF 45-50% with inferior hypokinesis. Recent labs: Results for Juanetta SnowUDGE, Olie RICHARD (MRN [id number]) as of 08/26/2018 13:36  Ref. Range 08/23/2018 07:53  Cholesterol, Total Latest Ref Range: 100 - 199 mg/dL 409137  HDL Cholesterol Latest Ref Range: >39 mg/dL 68  LDL (calc) Latest Ref Range: 0 - 99 mg/dL 55  Triglycerides Latest Ref Range: 0 - 149 mg/dL 69  VLDL Cholesterol Cal Latest Ref Range: 5 - 40 mg/dL 14   Results for Juanetta SnowUDGE, Clavin RICHARD (MRN  [id number]) as of 08/26/2018 13:36  Ref. Range 05/20/2018 10:48  Total CHOL/HDL Ratio Latest Units: RATIO 2.1  Cholesterol Latest Ref Range: 0 - 200 mg/dL 956133  HDL Cholesterol Latest Ref Range: >40 mg/dL 63  LDL (calc) Latest Ref Range: 0 - 99 mg/dL 61  Triglycerides Latest Ref Range: <150 mg/dL 46  VLDL Latest Ref Range: 0 - 40 mg/dL 9    Review of Systems  Constitution: Negative for decreased appetite, malaise/fatigue, weight gain and weight loss.  HENT: Negative for congestion.   Eyes: Negative for visual disturbance.  Cardiovascular: Positive for leg swelling. Negative for chest pain, dyspnea on exertion, palpitations and syncope.  Respiratory: Negative for shortness of breath.   Endocrine: Negative for cold intolerance.  Hematologic/Lymphatic: Does not bruise/bleed easily.  Skin: Negative for itching and rash.  Musculoskeletal: Negative for myalgias.  Gastrointestinal: Negative for abdominal pain, nausea and vomiting.  Genitourinary: Negative for dysuria.  Neurological: Negative for dizziness and weakness.  Psychiatric/Behavioral: The patient is not nervous/anxious.   All other systems reviewed and are negative.        Vitals:   12/22/18 0948  BP: 130/85  Pulse: 65    Objective:   Physical Exam  Constitutional: He is oriented to person, place, and time. He appears well-developed and well-nourished. No distress.  Pulmonary/Chest: Effort normal.  Neurological: He is alert and oriented to person, place, and time.  Psychiatric: He has a normal mood and affect.  Nursing note and vitals reviewed.         Assessment & Recommendations:   73 year old [place] American man coronary artery disease, PCI to OM1 and residual 04/2018, PCI to large mid RCA for non-STEMI in 05/2018 by me.  1. Coronary artery disease of native artery of native heart without angina pectoris (HCC) S/p Lcx PCI in [place] for unstable angina 04/2018, mid RCA PCI for NSTEMI 05/2018 by me. Symptoms  of angina have resoled with Ranexa 500 mg bid and metoprolol succinate 50 mg daily. Continue DAPT will 05/2019. Continue Lipitor 80 mg daily.  2. Hypertension: Stopped amlodipine due to leg edema. Start HCTZ 12.5 daily. Continue metoprolol succinate 50 mg daily and losartan 50 mg daily. In future, we may be able to reduce losartan to 25 mg daily, if BP trends downwards.  F/u VV in 3 months.   Elder NegusManish J Lyanna Blystone, MD [facility] Pager: [phone] Office: [phone] If no answer Cell [phone]

## 2019-01-12 ENCOUNTER — Ambulatory Visit: Payer: Medicare Other | Admitting: Cardiology

## 2019-02-10 ENCOUNTER — Other Ambulatory Visit: Payer: Self-pay

## 2019-02-10 DIAGNOSIS — I10 Essential (primary) hypertension: Secondary | ICD-10-CM

## 2019-02-10 DIAGNOSIS — I2510 Atherosclerotic heart disease of native coronary artery without angina pectoris: Secondary | ICD-10-CM

## 2019-02-10 MED ORDER — HYDROCHLOROTHIAZIDE 12.5 MG PO CAPS: 12.5000 mg | ORAL_CAPSULE | Freq: Every day | ORAL | 3 refills | Status: DC

## 2019-02-10 MED ORDER — RANOLAZINE ER 500 MG PO TB12: 500.0000 mg | ORAL_TABLET | Freq: Two times a day (BID) | ORAL | 1 refills | Status: DC

## 2019-02-10 MED ORDER — TICAGRELOR 90 MG PO TABS: 90.0000 mg | ORAL_TABLET | Freq: Two times a day (BID) | ORAL | 1 refills | Status: DC

## 2019-03-25 ENCOUNTER — Encounter: Payer: Self-pay | Admitting: Cardiology

## 2019-03-25 ENCOUNTER — Ambulatory Visit (INDEPENDENT_AMBULATORY_CARE_PROVIDER_SITE_OTHER): Payer: Medicare Other | Admitting: Cardiology

## 2019-03-25 VITALS — BP 125/75 | HR 65 | Wt 137.0 lb

## 2019-03-25 DIAGNOSIS — I10 Essential (primary) hypertension: Secondary | ICD-10-CM

## 2019-03-25 DIAGNOSIS — I2510 Atherosclerotic heart disease of native coronary artery without angina pectoris: Secondary | ICD-10-CM | POA: Diagnosis not present

## 2019-03-25 NOTE — Progress Notes (Signed)
Follow up visit  Subjective:   Tracy Figueroa, male    DOB: [birth year], 73 y.o.   MRN: [id number]   I connected with the patient on 03/25/2019 by a video enabled telemedicine application and verified that I am speaking with the correct person using two identifiers.     I discussed the limitations of evaluation and management by telemedicine and the availability of in person appointments. The patient expressed understanding and agreed to proceed.   This visit type was conducted due to national recommendations for restrictions regarding the COVID-19 Pandemic (e.g. social distancing).  This format is felt to be most appropriate for this patient at this time.  All issues noted in this document were discussed and addressed.  No physical exam was performed (except for noted visual exam findings with Tele health visits).  The patient has consented to conduct a Tele health visit and understands insurance will be billed.   Chief Complaint  Patient presents with  . Coronary Artery Disease     HPI   73 year old [place] American man coronary artery disease, PCI to OM1 and residual 04/2018, PCI to large mid RCA for non-STEMI in 05/2018 by me.  I reviewed blood pressure data provided by the patient.  Blood pressure has ranged around 130/70 mmHg recently. He denies chest pain, shortness of breath, palpitations, leg edema, orthopnea, PND, TIA/syncope. He is not walking as much as he did few months ago, but hopes to increases walking back to 10,000 steps/day.    Past Medical History:  Diagnosis Date  . Coronary artery disease   . High cholesterol   . Hypertension   . NSTEMI (non-ST elevated myocardial infarction) ([place]) 05/19/2018     Past Surgical History:  Procedure Laterality Date  . CATARACT EXTRACTION W/ INTRAOCULAR LENS  IMPLANT, BILATERAL Bilateral 2000s  . CORONARY ANGIOPLASTY WITH STENT PLACEMENT  04/12/2018; 05/20/2018   "in [place]; MCHC"  . CORONARY STENT INTERVENTION N/A  05/20/2018   Procedure: CORONARY STENT INTERVENTION;  Surgeon: Nigel Mormon, MD;  Location: [place] [place] [facility];  Service: Cardiovascular;  Laterality: N/A;  . FINGER FRACTURE SURGERY Left    "pinky"  . FRACTURE SURGERY    . [place]  . LEFT HEART CATH AND CORONARY ANGIOGRAPHY N/A 05/20/2018   Procedure: LEFT HEART CATH AND CORONARY ANGIOGRAPHY;  Surgeon: Nigel Mormon, MD;  Location: [place] [facility];  Service: Cardiovascular;  Laterality: N/A;  . LEFT HEART CATH AND CORONARY ANGIOGRAPHY N/A 10/26/2018   Procedure: LEFT HEART CATH AND CORONARY ANGIOGRAPHY;  Surgeon: Nigel Mormon, MD;  Location: [place] [facility];  Service: Cardiovascular;  Laterality: N/A;     Social History   Socioeconomic History  . Marital status: Married    Spouse name: Not on file  . Number of children: 0  . Years of education: Not on file  . Highest education level: Not on file  Occupational History  . Not on file  Social Needs  . Financial resource strain: Not on file  . Food insecurity    Worry: Not on file    Inability: Not on file  . Transportation needs    Medical: Not on file    Non-medical: Not on file  Tobacco Use  . Smoking status: Never Smoker  . Smokeless tobacco: Never Used  Substance and Sexual Activity  . Alcohol use: Yes    Alcohol/week: 9.0 standard drinks    Types: 9 Glasses of wine per week  .  Drug use: Not Currently    Types: Marijuana    Comment: 05/20/2018 "last weed was 1970"  . Sexual activity: Not on file  Lifestyle  . Physical activity    Days per week: Not on file    Minutes per session: Not on file  . Stress: Not on file  Relationships  . Social Musicianconnections    Talks on phone: Not on file    Gets together: Not on file    Attends religious service: Not on file    Active member of club or organization: Not on file    Attends meetings of clubs or organizations: Not on file    Relationship status: Not on file  . Intimate  partner violence    Fear of current or ex partner: Not on file    Emotionally abused: Not on file    Physically abused: Not on file    Forced sexual activity: Not on file  Other Topics Concern  . Not on file  Social History Narrative  . Not on file     Current Outpatient Medications on File Prior to Visit  Medication Sig Dispense Refill  . Ascorbic Acid (VITAMIN C) 1000 MG tablet Take 1,000 mg by mouth at bedtime.     Marland Kitchen. aspirin EC 81 MG tablet Take 81 mg by mouth daily.    Marland Kitchen. atorvastatin (LIPITOR) 80 MG tablet Take 1 tablet (80 mg total) by mouth every evening. 180 tablet 1  . hydrochlorothiazide (MICROZIDE) 12.5 MG capsule Take 1 capsule (12.5 mg total) by mouth daily. 120 capsule 3  . losartan (COZAAR) 50 MG tablet Take 1 tablet (50 mg total) by mouth daily. 180 tablet 1  . metoprolol succinate (TOPROL-XL) 25 MG 24 hr tablet Take 1 tablet (25 mg total) by mouth at bedtime. 180 tablet 1  . Multiple Vitamin (MULTIVITAMIN WITH MINERALS) TABS tablet Take 1 tablet by mouth at bedtime.     . nitroGLYCERIN (NITROSTAT) 0.4 MG SL tablet Place 1 tablet (0.4 mg total) under the tongue every 5 (five) minutes as needed for chest pain. 60 tablet 2  . ranolazine (RANEXA) 500 MG 12 hr tablet Take 1 tablet (500 mg total) by mouth 2 (two) times daily. 120 tablet 1  . sildenafil (VIAGRA) 50 MG tablet Take 1 tablet (50 mg total) by mouth daily as needed for erectile dysfunction. Do not take 48 hrs either side of using nitroglycerin 10 tablet 0  . ticagrelor (BRILINTA) 90 MG TABS tablet Take 1 tablet (90 mg total) by mouth 2 (two) times daily. 120 tablet 1   No current facility-administered medications on file prior to visit.     Cardiovascular studies:  Coronary angiography 10/26/2018: LM: Normal LAD: Mid 20% disease LCx: Patent distal LCx stent. 20% prox LCx stenosis RCA: Prox calcific 40% stenosis. Widely patent mid RCA stent Normal LVEDP  Impression: Patent LCx and RCA stents. Otherwise  mild nonobstructive CAD, unchanged compared to precious study in 05/2018.  Exercise Myoview stress test 10/04/2018: 1. The patient performed treadmill exercise using Bruce protocol, completing 10:45 minutes. The patient completed an estimated workload of 13 METS, reaching 102% of the maximum predicted heart rate. Exercise capacity was excellent. Hemodynamic response was normal. No stress symptoms reported. No ischemic changes seen on stress electrocardiogram.  2. The overall quality of the study is excellent. There is no evidence of abnormal lung activity. Stress and rest SPECT images demonstrate homogeneous tracer distribution throughout the myocardium. Gated SPECT imaging reveals normal myocardial thickening and  wall motion. The left ventricular ejection fraction was normal (58%).   3. Low risk study.      EKG 08/30/2018: Sinus rhythm 55 bpm. Normal axis. Normal conduction. Nonspoecific ST-T changes inferolateral leads. Inferior T wave inversion from 05/2019 EKG no longer seen.  Coronary angiogram 05/2018: LM: Normal LAD: Mid 20% disease LCx: Patent distal LCx stent RCA: Mid 99% stenosis Successful PTCA and stent placement Resolute Onyx 4.5 X 15 mm DES 0% residual stenosis with excellent apposition and expansion confirmed on IVUS.  Prox calcific 40% stenosis.  LVEPD Normal LVEF 45-50% with inferior hypokinesis. Recent labs: Results for IAN, CASTAGNA (MRN [id number]) as of 08/26/2018 13:36  Ref. Range 08/23/2018 07:53  Cholesterol, Total Latest Ref Range: 100 - 199 mg/dL 621  HDL Cholesterol Latest Ref Range: >39 mg/dL 68  LDL (calc) Latest Ref Range: 0 - 99 mg/dL 55  Triglycerides Latest Ref Range: 0 - 149 mg/dL 69  VLDL Cholesterol Cal Latest Ref Range: 5 - 40 mg/dL 14   Results for JAKYLAN, RON (MRN [id number]) as of 08/26/2018 13:36  Ref. Range 05/20/2018 10:48  Total CHOL/HDL Ratio Latest Units: RATIO 2.1  Cholesterol  Latest Ref Range: 0 - 200 mg/dL 962  HDL Cholesterol Latest Ref Range: >40 mg/dL 63  LDL (calc) Latest Ref Range: 0 - 99 mg/dL 61  Triglycerides Latest Ref Range: <150 mg/dL 46  VLDL Latest Ref Range: 0 - 40 mg/dL 9    Review of Systems  Constitution: Negative for decreased appetite, malaise/fatigue, weight gain and weight loss.  HENT: Negative for congestion.   Eyes: Negative for visual disturbance.  Cardiovascular: Positive for leg swelling. Negative for chest pain, dyspnea on exertion, palpitations and syncope.  Respiratory: Negative for shortness of breath.   Endocrine: Negative for cold intolerance.  Hematologic/Lymphatic: Does not bruise/bleed easily.  Skin: Negative for itching and rash.  Musculoskeletal: Negative for myalgias.  Gastrointestinal: Negative for abdominal pain, nausea and vomiting.  Genitourinary: Negative for dysuria.  Neurological: Negative for dizziness and weakness.  Psychiatric/Behavioral: The patient is not nervous/anxious.   All other systems reviewed and are negative.        Vitals:   03/22/19 1329  BP: 125/75  Pulse: 65    Objective:   Physical Exam  Constitutional: He is oriented to person, place, and time. He appears well-developed and well-nourished. No distress.  Pulmonary/Chest: Effort normal.  Neurological: He is alert and oriented to person, place, and time.  Psychiatric: He has a normal mood and affect.  Nursing note and vitals reviewed.         Assessment & Recommendations:   73 year old [name] coronary artery disease, PCI to OM1 and residual 04/2018, PCI to large mid RCA for non-STEMI in 05/2018 by me.  1. 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 No angina symptoms. Continue metoprolol succinate 25 mg daily, Ranexa 500 mg bid,  Continue DAPT till next visit in 09/2019. At that  point, will consider continuing Brilinta at 60 mg bid, given his recurrent MI episodes. Continue Lipitor 80 mg daily.  2. Hypertension: Controlled.  F/u in 6 months.   Elder Negus, MD [facility] Pager: [phone] Office: [phone] If no answer Cell [phone]

## 2019-05-20 ENCOUNTER — Other Ambulatory Visit: Payer: Self-pay | Admitting: Cardiology

## 2019-05-20 DIAGNOSIS — I2510 Atherosclerotic heart disease of native coronary artery without angina pectoris: Secondary | ICD-10-CM

## 2019-06-29 ENCOUNTER — Ambulatory Visit: Payer: Medicare Other

## 2019-06-30 ENCOUNTER — Other Ambulatory Visit: Payer: Self-pay | Admitting: Family Medicine

## 2019-06-30 DIAGNOSIS — R634 Abnormal weight loss: Secondary | ICD-10-CM

## 2019-07-04 ENCOUNTER — Other Ambulatory Visit: Payer: Medicare Other

## 2019-07-07 ENCOUNTER — Ambulatory Visit: Payer: Medicare PPO | Attending: Internal Medicine

## 2019-07-07 ENCOUNTER — Ambulatory Visit
Admission: RE | Admit: 2019-07-07 | Discharge: 2019-07-07 | Disposition: A | Discharge status: Home / Self Care | Payer: Medicare PPO | Source: Ambulatory Visit | Attending: Family Medicine | Admitting: Family Medicine

## 2019-07-07 ENCOUNTER — Ambulatory Visit
Admission: RE | Admit: 2019-07-07 | Discharge: 2019-07-07 | Disposition: A | Discharge status: Home / Self Care | Payer: Medicare Other | Source: Ambulatory Visit | Attending: Family Medicine | Admitting: Family Medicine

## 2019-07-07 DIAGNOSIS — Z23 Encounter for immunization: Secondary | ICD-10-CM | POA: Insufficient documentation

## 2019-07-07 DIAGNOSIS — R634 Abnormal weight loss: Secondary | ICD-10-CM

## 2019-07-07 MED ORDER — IOPAMIDOL (ISOVUE-300) INJECTION 61%
100.0000 mL | Freq: Once | INTRAVENOUS | Status: AC | PRN
  Administered 2019-07-07: 100 mL via INTRAVENOUS

## 2019-07-07 NOTE — Progress Notes (Signed)
   Covid-19 Vaccination Clinic  Name:  Tracy Figueroa    MRN: [id number] DOB: 1945/07/29  07/07/2019  [name] was observed post Covid-19 immunization for 15 minutes without incidence. He was provided with Vaccine Information Sheet and instruction to access the V-Safe system.   [name] was instructed to call 911 with any severe reactions post vaccine: Marland Kitchen Difficulty breathing  . Swelling of your face and throat  . A fast heartbeat  . A bad rash all over your body  . Dizziness and weakness    Immunizations Administered    Name Date Dose VIS Date Route   Pfizer COVID-19 Vaccine 07/07/2019  8:58 AM 0.3 mL 05/13/2019 Intramuscular   Manufacturer: [employer], [employer]   Lot: [id number]   NDC: [address]-6

## 2019-07-20 ENCOUNTER — Ambulatory Visit: Payer: Medicare Other

## 2019-08-01 ENCOUNTER — Ambulatory Visit: Payer: Medicare PPO | Attending: Internal Medicine

## 2019-08-01 DIAGNOSIS — Z23 Encounter for immunization: Secondary | ICD-10-CM | POA: Insufficient documentation

## 2019-08-01 NOTE — Progress Notes (Signed)
   Covid-19 Vaccination Clinic  Name:  Tracy Figueroa    MRN: [id number] DOB: [birth year]  08/01/2019  Mr. [name] was observed post Covid-19 immunization for 15 minutes without incidence. He was provided with Vaccine Information Sheet and instruction to access the V-Safe system.   Mr. [name] was instructed to call 911 with any severe reactions post vaccine: Marland Kitchen Difficulty breathing  . Swelling of your face and throat  . A fast heartbeat  . A bad rash all over your body  . Dizziness and weakness    Immunizations Administered    Name Date Dose VIS Date Route   Pfizer COVID-19 Vaccine 08/01/2019  1:24 PM 0.3 mL 05/13/2019 Intramuscular   Manufacturer: [employer], [employer]   Lot: [id number]   NDC: [address]-8

## 2019-08-04 DIAGNOSIS — R634 Abnormal weight loss: Secondary | ICD-10-CM | POA: Diagnosis not present

## 2019-08-17 ENCOUNTER — Other Ambulatory Visit: Payer: Medicare PPO

## 2019-08-18 ENCOUNTER — Other Ambulatory Visit: Payer: Self-pay

## 2019-08-18 ENCOUNTER — Ambulatory Visit: Payer: Medicare PPO | Attending: Internal Medicine

## 2019-08-18 DIAGNOSIS — Z20822 Contact with and (suspected) exposure to covid-19: Secondary | ICD-10-CM

## 2019-08-19 ENCOUNTER — Other Ambulatory Visit: Payer: Medicare PPO

## 2019-08-19 LAB — NOVEL CORONAVIRUS, NAA: SARS-CoV-2, NAA: NOT DETECTED

## 2019-09-07 DIAGNOSIS — Z125 Encounter for screening for malignant neoplasm of prostate: Secondary | ICD-10-CM | POA: Diagnosis not present

## 2019-09-07 DIAGNOSIS — I10 Essential (primary) hypertension: Secondary | ICD-10-CM | POA: Diagnosis not present

## 2019-09-07 DIAGNOSIS — Z1159 Encounter for screening for other viral diseases: Secondary | ICD-10-CM | POA: Diagnosis not present

## 2019-09-08 ENCOUNTER — Telehealth: Payer: Self-pay

## 2019-09-08 DIAGNOSIS — I2510 Atherosclerotic heart disease of native coronary artery without angina pectoris: Secondary | ICD-10-CM | POA: Diagnosis not present

## 2019-09-08 DIAGNOSIS — Z0000 Encounter for general adult medical examination without abnormal findings: Secondary | ICD-10-CM | POA: Diagnosis not present

## 2019-09-08 DIAGNOSIS — N401 Enlarged prostate with lower urinary tract symptoms: Secondary | ICD-10-CM | POA: Diagnosis not present

## 2019-09-08 DIAGNOSIS — E7800 Pure hypercholesterolemia, unspecified: Secondary | ICD-10-CM | POA: Diagnosis not present

## 2019-09-08 DIAGNOSIS — Z1159 Encounter for screening for other viral diseases: Secondary | ICD-10-CM | POA: Diagnosis not present

## 2019-09-08 DIAGNOSIS — N529 Male erectile dysfunction, unspecified: Secondary | ICD-10-CM | POA: Diagnosis not present

## 2019-09-08 DIAGNOSIS — Z86008 Personal history of in-situ neoplasm of other site: Secondary | ICD-10-CM | POA: Diagnosis not present

## 2019-09-08 DIAGNOSIS — Z86010 Personal history of colonic polyps: Secondary | ICD-10-CM | POA: Diagnosis not present

## 2019-09-08 DIAGNOSIS — I10 Essential (primary) hypertension: Secondary | ICD-10-CM | POA: Diagnosis not present

## 2019-09-09 ENCOUNTER — Telehealth: Payer: Self-pay

## 2019-09-09 DIAGNOSIS — I2510 Atherosclerotic heart disease of native coronary artery without angina pectoris: Secondary | ICD-10-CM

## 2019-09-09 MED ORDER — TICAGRELOR 90 MG PO TABS: ORAL_TABLET | ORAL | 1 refills | Status: DC

## 2019-09-09 NOTE — Telephone Encounter (Signed)
Stop Brilinta. Continue Aspirin, if possible.  Thanks MJP

## 2019-09-09 NOTE — Telephone Encounter (Signed)
My note from 05/2019: Continue DAPT till next visit in 09/2019. At that point, will consider continuing Brilinta at 60 mg bid, given his recurrent MI episodes.  Patient has no absolute indication to continue Brilinta at this point.  If he has any ongoing GI work-up, or concern for bleeding, recommend stopping Brilinta at this time.  Thanks MJP

## 2019-09-09 NOTE — Telephone Encounter (Signed)
Telephone encounter:  Reason for call: Patient is having acolonoscopy on 09/24/19 w/ Dr Evette Cristal @ [facility] he is wondering if/when he needs to stop his blood thinners.   Usual provider: MP  Last office visit: 03/25/19  Next office visit: 09/23/19   Last hospitalization: NA   Current Outpatient Medications on File Prior to Visit  Medication Sig Dispense Refill  . Ascorbic Acid (VITAMIN C) 1000 MG tablet Take 1,000 mg by mouth at bedtime.     Marland Kitchen aspirin EC 81 MG tablet Take 81 mg by mouth daily.    Marland Kitchen atorvastatin (LIPITOR) 80 MG tablet Take 1 tablet (80 mg total) by mouth every evening. 180 tablet 1  . hydrochlorothiazide (MICROZIDE) 12.5 MG capsule Take 1 capsule (12.5 mg total) by mouth daily. 120 capsule 3  . losartan (COZAAR) 50 MG tablet Take 1 tablet (50 mg total) by mouth daily. 180 tablet 1  . metoprolol succinate (TOPROL-XL) 25 MG 24 hr tablet Take 1 tablet (25 mg total) by mouth at bedtime. 180 tablet 1  . Multiple Vitamin (MULTIVITAMIN WITH MINERALS) TABS tablet Take 1 tablet by mouth at bedtime.     . nitroGLYCERIN (NITROSTAT) 0.4 MG SL tablet Place 1 tablet (0.4 mg total) under the tongue every 5 (five) minutes as needed for chest pain. 60 tablet 2  . ranolazine (RANEXA) 500 MG 12 hr tablet Take 1 tablet (500 mg total) by mouth 2 (two) times daily. 120 tablet 1  . sildenafil (VIAGRA) 50 MG tablet Take 1 tablet (50 mg total) by mouth daily as needed for erectile dysfunction. Do not take 48 hrs either side of using nitroglycerin 10 tablet 0   No current facility-administered medications on file prior to visit.

## 2019-09-23 ENCOUNTER — Ambulatory Visit: Payer: Medicare PPO | Admitting: Cardiology

## 2019-09-23 ENCOUNTER — Other Ambulatory Visit: Payer: Self-pay

## 2019-09-23 ENCOUNTER — Encounter: Payer: Self-pay | Admitting: Cardiology

## 2019-09-23 VITALS — BP 138/86 | HR 59 | Temp 97.7°F | Ht 70.0 in | Wt 144.0 lb

## 2019-09-23 DIAGNOSIS — R634 Abnormal weight loss: Secondary | ICD-10-CM | POA: Diagnosis not present

## 2019-09-23 DIAGNOSIS — I2510 Atherosclerotic heart disease of native coronary artery without angina pectoris: Secondary | ICD-10-CM | POA: Diagnosis not present

## 2019-09-23 NOTE — Progress Notes (Signed)
Follow up visit  Subjective:   Tracy Figueroa, male    DOB: [birth year], 74 y.o.   MRN: [id number]    Chief Complaint  Patient presents with  . Hypertension  . Coronary Artery Disease    6 month f/u     74 year old [place] American man with CAD.  He is doing well, and denies chest pain, shortness of breath, palpitations, leg edema, orthopnea, PND, TIA/syncope. Blood pressures and lipids are well controlled. He has has had unintentional weight loss, but denies any loss of appetite, fatigue, night sweats or ay other consitutional symptoms. He has upcoming EGD and colonoscopy scheduled with GI soon.    Current Outpatient Medications on File Prior to Visit  Medication Sig Dispense Refill  . Ascorbic Acid (VITAMIN C) 1000 MG tablet Take 1,000 mg by mouth at bedtime.     Marland Kitchen aspirin EC 81 MG tablet Take 81 mg by mouth daily.    Marland Kitchen atorvastatin (LIPITOR) 80 MG tablet Take 1 tablet (80 mg total) by mouth every evening. 180 tablet 1  . metoprolol succinate (TOPROL-XL) 25 MG 24 hr tablet Take 1 tablet (25 mg total) by mouth at bedtime. 180 tablet 1  . Multiple Vitamin (MULTIVITAMIN WITH MINERALS) TABS tablet Take 1 tablet by mouth at bedtime.     . nitroGLYCERIN (NITROSTAT) 0.4 MG SL tablet Place 1 tablet (0.4 mg total) under the tongue every 5 (five) minutes as needed for chest pain. 60 tablet 2  . PRESCRIPTION MEDICATION Take 12.5 mg by mouth daily. Diupress    . ranolazine (RANEXA) 500 MG 12 hr tablet Take 1 tablet (500 mg total) by mouth 2 (two) times daily. 120 tablet 1  . sildenafil (VIAGRA) 50 MG tablet Take 1 tablet (50 mg total) by mouth daily as needed for erectile dysfunction. Do not take 48 hrs either side of using nitroglycerin 10 tablet 0   No current facility-administered medications on file prior to visit.    Cardiovascular studies:  EKG 09/23/2019: Sinus rhythm 74 bpm. Two PVC's.  Coronary angiography 10/26/2018: LM: Normal LAD: Mid 20% disease LCx: Patent  distal LCx stent. 20% prox LCx stenosis RCA: Prox calcific 40% stenosis. Widely patent mid RCA stent Normal LVEDP  Impression: Patent LCx and RCA stents. Otherwise mild nonobstructive CAD, unchanged compared to precious study in 05/2018.  Exercise Myoview stress test 10/04/2018: 1. The patient performed treadmill exercise using Bruce protocol, completing 10:45 minutes. The patient completed an estimated workload of 13 METS, reaching 102% of the maximum predicted heart rate. Exercise capacity was excellent. Hemodynamic response was normal. No stress symptoms reported. No ischemic changes seen on stress electrocardiogram.  2. The overall quality of the study is excellent. There is no evidence of abnormal lung activity. Stress and rest SPECT images demonstrate homogeneous tracer distribution throughout the myocardium. Gated SPECT imaging reveals normal myocardial thickening and wall motion. The left ventricular ejection fraction was normal (58%).   3. Low risk study.    Coronary angiography/intervntion 05/2018: LM: Normal LAD: Mid 20% disease LCx: Patent distal LCx stent RCA: Mid 99% stenosis Successful PTCA and stent placement Resolute Onyx 4.5 X 15 mm DES 0% residual stenosis with excellent apposition and expansion confirmed on IVUS.  Prox calcific 40% stenosis.  LVEPD Normal LVEF 45-50% with inferior hypokinesis.  Recent labs: Feb 2021: Chol 133, TG 56, HDL 68, LDL 52  08/2018: Chol 137, TG 69, HDL 68, LDL 55    Review of Systems  Cardiovascular: Negative for chest pain, dyspnea  on exertion, leg swelling, palpitations and syncope.         Vitals:   09/23/19 1133  BP: 138/86  Pulse: (!) 59  Temp: 97.7 F (36.5 C)  SpO2: 99%    Objective:   Physical Exam  Constitutional: No distress.  Neck: No JVD present.  Cardiovascular: Normal rate, regular rhythm, normal heart sounds and intact distal pulses.  No murmur  heard. Pulmonary/Chest: Effort normal and breath sounds normal. He has no wheezes. He has no rales.  Musculoskeletal:        General: No edema.  Lymphadenopathy:    He has no cervical adenopathy.  Nursing note and vitals reviewed.         Assessment & Recommendations:   74 year old [name] with CAD.  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 No angina symptoms. Stopped Brilinta. Continue Aspirin 81 mg daily. Continue metoprolol succinate 25 mg daily, Ranexa 500 mg bid, Lipitor 80 mg daily.  Hypertension: Controlled.  Unintentional weight loss: No other constitutional symptoms. Agree with EGD?colonoscopy.   F/u in 1 year  Elder Negus, MD [facility] Pager: [phone] Office: [phone] If no answer Cell [phone]

## 2019-09-27 DIAGNOSIS — Z1159 Encounter for screening for other viral diseases: Secondary | ICD-10-CM | POA: Diagnosis not present

## 2019-09-30 DIAGNOSIS — R634 Abnormal weight loss: Secondary | ICD-10-CM | POA: Diagnosis not present

## 2019-11-01 DIAGNOSIS — Z1159 Encounter for screening for other viral diseases: Secondary | ICD-10-CM | POA: Diagnosis not present

## 2019-11-03 DIAGNOSIS — R634 Abnormal weight loss: Secondary | ICD-10-CM | POA: Diagnosis not present

## 2019-11-10 DIAGNOSIS — Z20822 Contact with and (suspected) exposure to covid-19: Secondary | ICD-10-CM | POA: Diagnosis not present

## 2019-11-26 DIAGNOSIS — R69 Illness, unspecified: Secondary | ICD-10-CM | POA: Diagnosis not present

## 2020-01-04 DIAGNOSIS — R69 Illness, unspecified: Secondary | ICD-10-CM | POA: Diagnosis not present

## 2020-01-10 DIAGNOSIS — R69 Illness, unspecified: Secondary | ICD-10-CM | POA: Diagnosis not present

## 2020-01-13 ENCOUNTER — Other Ambulatory Visit: Payer: Self-pay | Admitting: Cardiology

## 2020-01-13 DIAGNOSIS — I2510 Atherosclerotic heart disease of native coronary artery without angina pectoris: Secondary | ICD-10-CM

## 2020-02-13 DIAGNOSIS — R69 Illness, unspecified: Secondary | ICD-10-CM | POA: Diagnosis not present

## 2020-02-15 DIAGNOSIS — Z8781 Personal history of (healed) traumatic fracture: Secondary | ICD-10-CM | POA: Diagnosis not present

## 2020-02-15 DIAGNOSIS — Z03818 Encounter for observation for suspected exposure to other biological agents ruled out: Secondary | ICD-10-CM | POA: Diagnosis not present

## 2020-03-02 DIAGNOSIS — M25562 Pain in left knee: Secondary | ICD-10-CM | POA: Diagnosis not present

## 2020-03-02 DIAGNOSIS — M25522 Pain in left elbow: Secondary | ICD-10-CM | POA: Diagnosis not present

## 2020-03-02 DIAGNOSIS — Z8781 Personal history of (healed) traumatic fracture: Secondary | ICD-10-CM | POA: Diagnosis not present

## 2020-03-06 DIAGNOSIS — M25522 Pain in left elbow: Secondary | ICD-10-CM | POA: Diagnosis not present

## 2020-03-06 DIAGNOSIS — M25562 Pain in left knee: Secondary | ICD-10-CM | POA: Diagnosis not present

## 2020-03-06 DIAGNOSIS — Z8781 Personal history of (healed) traumatic fracture: Secondary | ICD-10-CM | POA: Diagnosis not present

## 2020-03-07 DIAGNOSIS — M25522 Pain in left elbow: Secondary | ICD-10-CM | POA: Diagnosis not present

## 2020-03-08 DIAGNOSIS — M25562 Pain in left knee: Secondary | ICD-10-CM | POA: Diagnosis not present

## 2020-03-08 DIAGNOSIS — Z8781 Personal history of (healed) traumatic fracture: Secondary | ICD-10-CM | POA: Diagnosis not present

## 2020-03-08 DIAGNOSIS — M25522 Pain in left elbow: Secondary | ICD-10-CM | POA: Diagnosis not present

## 2020-03-12 DIAGNOSIS — M25562 Pain in left knee: Secondary | ICD-10-CM | POA: Diagnosis not present

## 2020-03-12 DIAGNOSIS — Z8781 Personal history of (healed) traumatic fracture: Secondary | ICD-10-CM | POA: Diagnosis not present

## 2020-03-12 DIAGNOSIS — M25522 Pain in left elbow: Secondary | ICD-10-CM | POA: Diagnosis not present

## 2020-03-15 DIAGNOSIS — M25562 Pain in left knee: Secondary | ICD-10-CM | POA: Diagnosis not present

## 2020-03-15 DIAGNOSIS — M25522 Pain in left elbow: Secondary | ICD-10-CM | POA: Diagnosis not present

## 2020-03-15 DIAGNOSIS — Z8781 Personal history of (healed) traumatic fracture: Secondary | ICD-10-CM | POA: Diagnosis not present

## 2020-03-19 DIAGNOSIS — M25522 Pain in left elbow: Secondary | ICD-10-CM | POA: Diagnosis not present

## 2020-03-19 DIAGNOSIS — M25562 Pain in left knee: Secondary | ICD-10-CM | POA: Diagnosis not present

## 2020-03-19 DIAGNOSIS — Z8781 Personal history of (healed) traumatic fracture: Secondary | ICD-10-CM | POA: Diagnosis not present

## 2020-03-22 DIAGNOSIS — M25562 Pain in left knee: Secondary | ICD-10-CM | POA: Diagnosis not present

## 2020-03-22 DIAGNOSIS — M25522 Pain in left elbow: Secondary | ICD-10-CM | POA: Diagnosis not present

## 2020-03-22 DIAGNOSIS — Z8781 Personal history of (healed) traumatic fracture: Secondary | ICD-10-CM | POA: Diagnosis not present

## 2020-03-26 DIAGNOSIS — M25522 Pain in left elbow: Secondary | ICD-10-CM | POA: Diagnosis not present

## 2020-03-26 DIAGNOSIS — Z8781 Personal history of (healed) traumatic fracture: Secondary | ICD-10-CM | POA: Diagnosis not present

## 2020-03-26 DIAGNOSIS — M25562 Pain in left knee: Secondary | ICD-10-CM | POA: Diagnosis not present

## 2020-03-29 DIAGNOSIS — M25562 Pain in left knee: Secondary | ICD-10-CM | POA: Diagnosis not present

## 2020-03-29 DIAGNOSIS — M25522 Pain in left elbow: Secondary | ICD-10-CM | POA: Diagnosis not present

## 2020-03-29 DIAGNOSIS — Z8781 Personal history of (healed) traumatic fracture: Secondary | ICD-10-CM | POA: Diagnosis not present

## 2020-04-02 DIAGNOSIS — Z8781 Personal history of (healed) traumatic fracture: Secondary | ICD-10-CM | POA: Diagnosis not present

## 2020-04-02 DIAGNOSIS — M25562 Pain in left knee: Secondary | ICD-10-CM | POA: Diagnosis not present

## 2020-04-02 DIAGNOSIS — M25522 Pain in left elbow: Secondary | ICD-10-CM | POA: Diagnosis not present

## 2020-04-04 DIAGNOSIS — M25522 Pain in left elbow: Secondary | ICD-10-CM | POA: Diagnosis not present

## 2020-05-10 DIAGNOSIS — Z20822 Contact with and (suspected) exposure to covid-19: Secondary | ICD-10-CM | POA: Diagnosis not present

## 2020-05-14 DIAGNOSIS — Z20822 Contact with and (suspected) exposure to covid-19: Secondary | ICD-10-CM | POA: Diagnosis not present

## 2020-05-22 IMAGING — DX DG CHEST 2V
2 series · 2 of 2 positions shown · non-contrast
Comparison: None

CLINICAL DATA: Intermittent chest pain

EXAM:
CHEST - 2 VIEW

[chest pa]
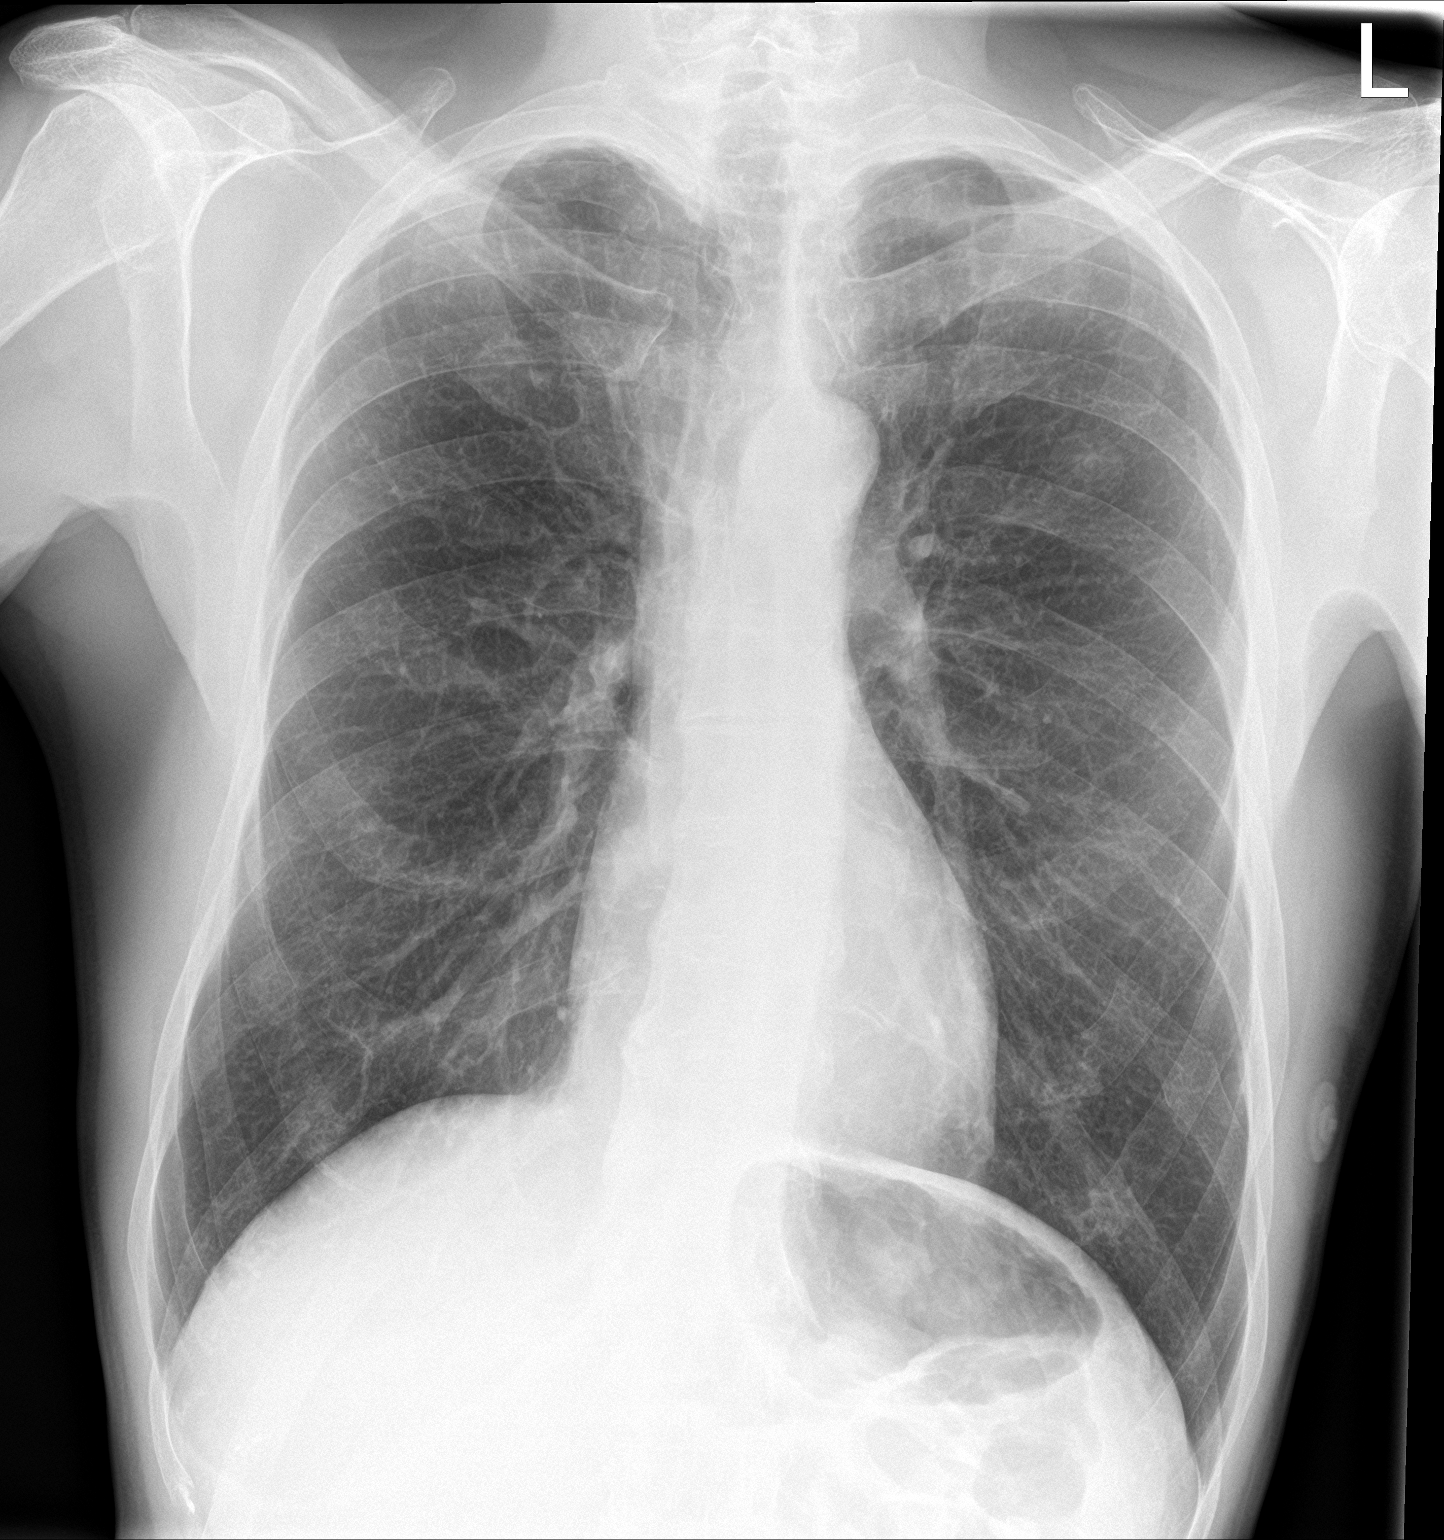

[chest lat]
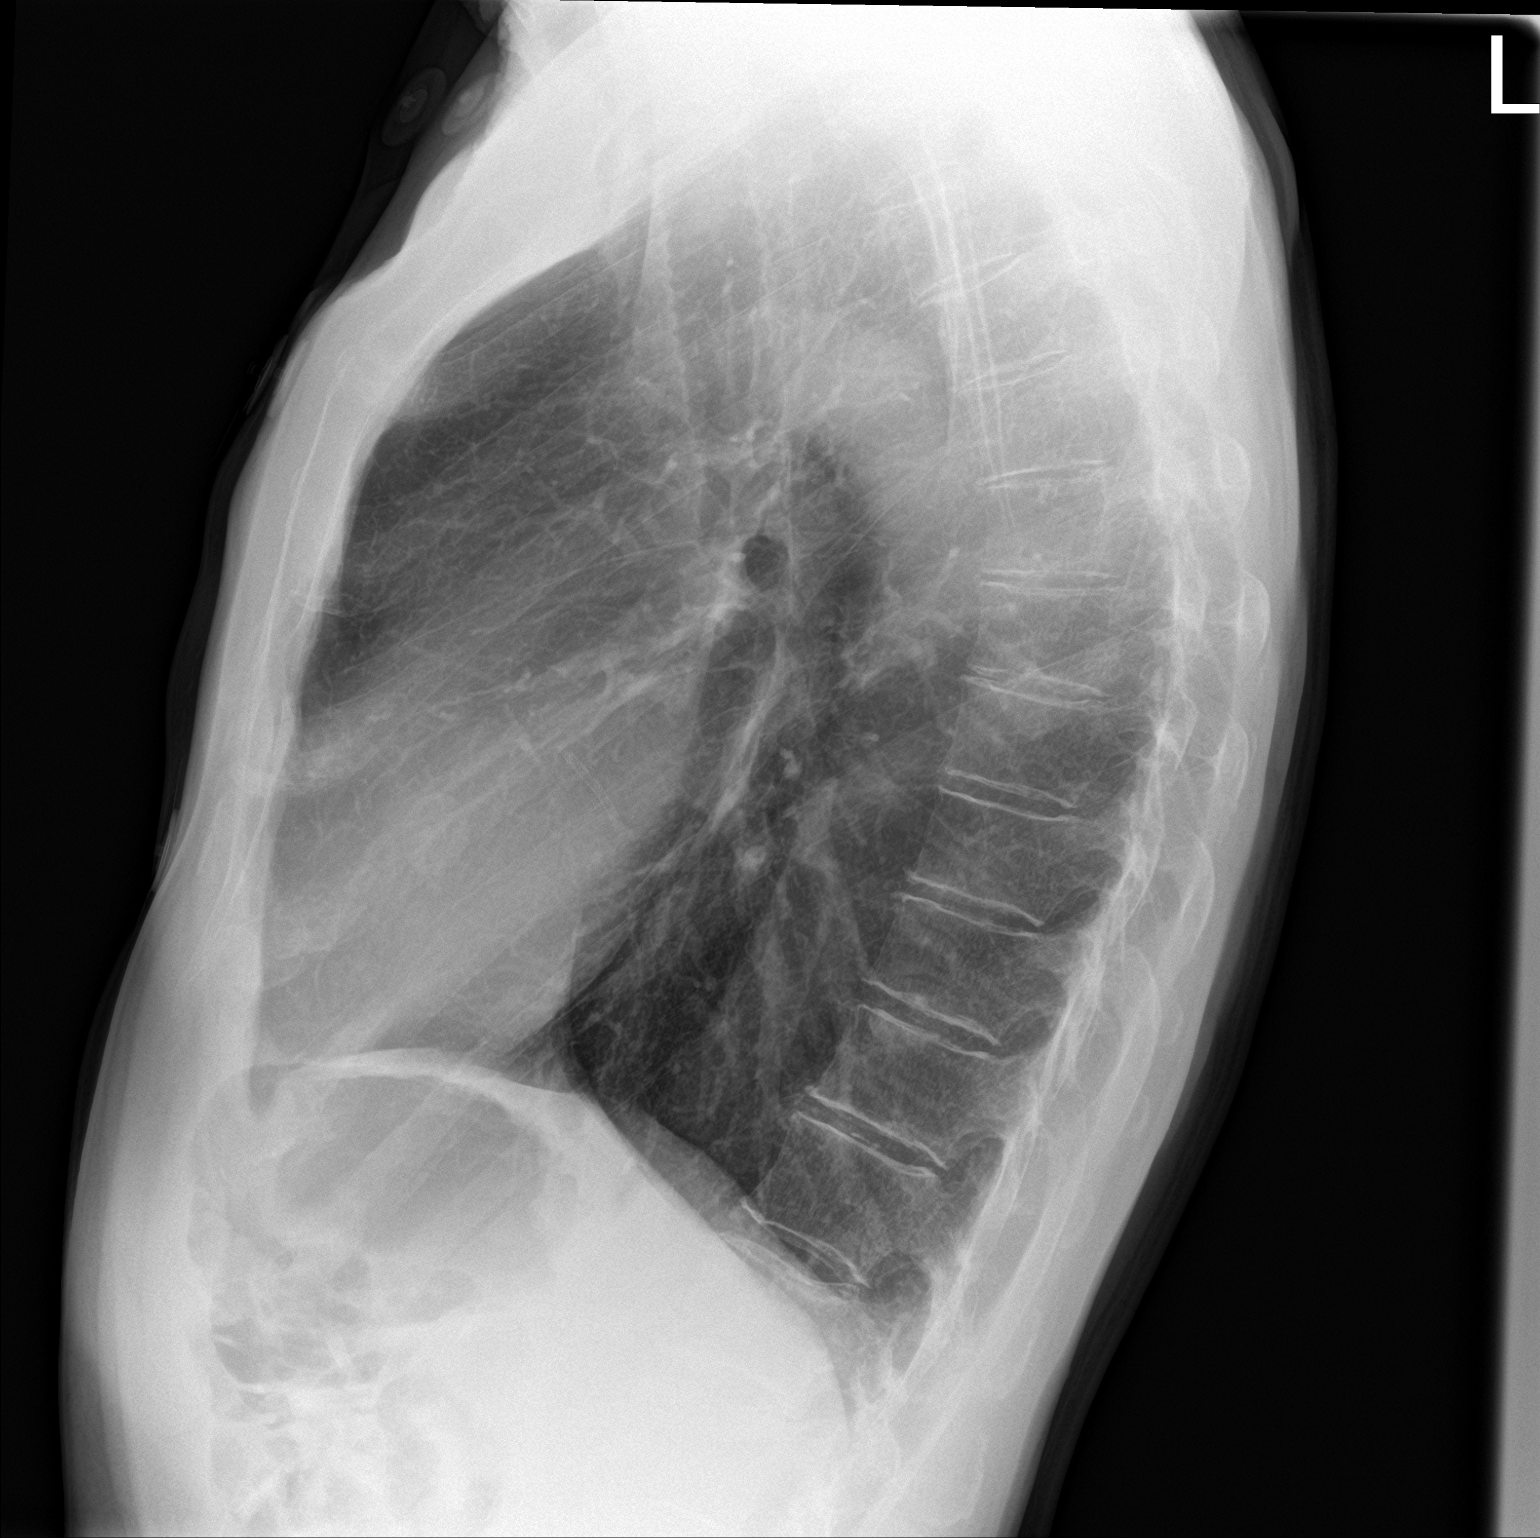

[2 of 2 positions shown; findings below may reference images not displayed]

FINDINGS: Normal heart size, mediastinal contours, and pulmonary vascularity.

Coronary arterial stent noted.

Emphysematous changes without infiltrate, pleural effusion, or
pneumothorax.

EKG leads project over the upper chest.

Bones demineralized with scattered costal cartilaginous
calcifications.
IMPRESSION: COPD changes without acute infiltrate.

## 2020-07-30 DIAGNOSIS — Z20822 Contact with and (suspected) exposure to covid-19: Secondary | ICD-10-CM | POA: Diagnosis not present

## 2020-08-21 ENCOUNTER — Other Ambulatory Visit: Payer: Self-pay | Admitting: Cardiology

## 2020-09-21 ENCOUNTER — Other Ambulatory Visit: Payer: Self-pay

## 2020-09-21 ENCOUNTER — Encounter: Payer: Self-pay | Admitting: Cardiology

## 2020-09-21 ENCOUNTER — Ambulatory Visit: Payer: Medicare PPO | Admitting: Cardiology

## 2020-09-21 VITALS — BP 116/80 | HR 63 | Temp 98.3°F | Resp 17 | Ht 70.0 in | Wt 147.0 lb

## 2020-09-21 DIAGNOSIS — I2510 Atherosclerotic heart disease of native coronary artery without angina pectoris: Secondary | ICD-10-CM

## 2020-09-21 NOTE — Progress Notes (Signed)
Follow up visit  Subjective:   Tracy Figueroa, male    DOB: 1945-11-16, 75 y.o.   MRN: [id number]    Chief Complaint  Patient presents with  . Coronary Artery Disease  . Hypertension  . Follow-up    1 year     75 year old [name] with CAD.  He is doing well, and denies chest pain, shortness of breath, palpitations, leg edema, orthopnea, PND, TIA/syncope. He walks at least 10,000 steps 5-7 times/week without any difficulty.    Current Outpatient Medications on File Prior to Visit  Medication Sig Dispense Refill  . Ascorbic Acid (VITAMIN C) 1000 MG tablet Take 1,000 mg by mouth at bedtime.     Marland Kitchen aspirin EC 81 MG tablet Take 81 mg by mouth daily.    Marland Kitchen atorvastatin (LIPITOR) 80 MG tablet Take 1 tablet (80 mg total) by mouth every evening. 180 tablet 1  . metoprolol succinate (TOPROL-XL) 25 MG 24 hr tablet TAKE 1 TABLET BY MOUTH EVERY DAY 90 tablet 0  . Multiple Vitamin (MULTIVITAMIN WITH MINERALS) TABS tablet Take 1 tablet by mouth at bedtime.     . nitroGLYCERIN (NITROSTAT) 0.4 MG SL tablet Place 1 tablet (0.4 mg total) under the tongue every 5 (five) minutes as needed for chest pain. 60 tablet 2  . PRESCRIPTION MEDICATION Take 12.5 mg by mouth daily. Diupress    . ranolazine (RANEXA) 500 MG 12 hr tablet TAKE 1 TABLET BY MOUTH TWICE A DAY 180 tablet 3  . sildenafil (VIAGRA) 50 MG tablet Take 1 tablet (50 mg total) by mouth daily as needed for erectile dysfunction. Do not take 48 hrs either side of using nitroglycerin 10 tablet 0   No current facility-administered medications on file prior to visit.    Cardiovascular studies:  EKG 09/21/2020: Sinus rhythm 68 bpm Normal EKG  Coronary angiography 10/26/2018: LM: Normal LAD: Mid 20% disease LCx: Patent distal LCx stent. 20% prox LCx stenosis RCA: Prox calcific 40% stenosis. Widely patent mid RCA stent Normal LVEDP  Impression: Patent LCx and RCA stents. Otherwise mild nonobstructive CAD, unchanged  compared to precious study in 05/2018.  Exercise Myoview stress test 10/04/2018: 1. The patient performed treadmill exercise using Bruce protocol, completing 10:45 minutes. The patient completed an estimated workload of 13 METS, reaching 102% of the maximum predicted heart rate. Exercise capacity was excellent. Hemodynamic response was normal. No stress symptoms reported. No ischemic changes seen on stress electrocardiogram.  2. The overall quality of the study is excellent. There is no evidence of abnormal lung activity. Stress and rest SPECT images demonstrate homogeneous tracer distribution throughout the myocardium. Gated SPECT imaging reveals normal myocardial thickening and wall motion. The left ventricular ejection fraction was normal (58%).   3. Low risk study.    Coronary angiography/intervntion 05/2018: LM: Normal LAD: Mid 20% disease LCx: Patent distal LCx stent RCA: Mid 99% stenosis Successful PTCA and stent placement Resolute Onyx 4.5 X 15 mm DES 0% residual stenosis with excellent apposition and expansion confirmed on IVUS.  Prox calcific 40% stenosis.  LVEPD Normal LVEF 45-50% with inferior hypokinesis.  Recent labs: Feb 2021: Chol 133, TG 56, HDL 68, LDL 52  08/2018: Chol 137, TG 69, HDL 68, LDL 55    Review of Systems  Cardiovascular: Negative for chest pain, dyspnea on exertion, leg swelling, palpitations and syncope.         Vitals:   09/21/20 1131  BP: 116/80  Pulse: 63  Resp: 17  Temp: 98.3  F (36.8 C)  SpO2: 98%    Objective:   Physical Exam Vitals and nursing note reviewed.  Constitutional:      General: He is not in acute distress. Neck:     Vascular: No JVD.  Cardiovascular:     Rate and Rhythm: Normal rate and regular rhythm.     Pulses: Intact distal pulses.     Heart sounds: Normal heart sounds. No murmur heard.   Pulmonary:     Effort: Pulmonary effort is normal.     Breath sounds: Normal  breath sounds. No wheezing or rales.  Lymphadenopathy:     Cervical: No cervical adenopathy.           Assessment & Recommendations:   75 year old [name] with CAD.  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 No angina symptoms. Stopped Brilinta. Continue Aspirin 81 mg daily. Continue metoprolol succinate 25 mg daily, Lipitor 80 mg daily. Okay to try coming off Ranexa. Patient will let me know in a few weeks if he tolerates this. Check lipid panel  Hypertension: Controlled.  F/u in 1 year  Elder Negus, MD [facility] Pager: [phone] Office: [phone] If no answer Cell [phone]

## 2020-09-24 ENCOUNTER — Ambulatory Visit: Payer: Medicare PPO | Admitting: Cardiology

## 2020-10-17 ENCOUNTER — Other Ambulatory Visit: Payer: Self-pay

## 2020-10-17 ENCOUNTER — Other Ambulatory Visit: Payer: Self-pay | Admitting: Cardiology

## 2020-10-17 DIAGNOSIS — I2510 Atherosclerotic heart disease of native coronary artery without angina pectoris: Secondary | ICD-10-CM

## 2020-10-17 MED ORDER — METOPROLOL SUCCINATE ER 25 MG PO TB24: 1.0000 | ORAL_TABLET | Freq: Every day | ORAL | 0 refills | Status: DC

## 2020-10-17 MED ORDER — ATORVASTATIN CALCIUM 80 MG PO TABS: 80.0000 mg | ORAL_TABLET | Freq: Every evening | ORAL | 0 refills | Status: DC

## 2021-01-10 DIAGNOSIS — R351 Nocturia: Secondary | ICD-10-CM | POA: Diagnosis not present

## 2021-01-10 DIAGNOSIS — R3915 Urgency of urination: Secondary | ICD-10-CM | POA: Diagnosis not present

## 2021-01-10 DIAGNOSIS — N401 Enlarged prostate with lower urinary tract symptoms: Secondary | ICD-10-CM | POA: Diagnosis not present

## 2021-04-01 ENCOUNTER — Encounter: Payer: Self-pay | Admitting: Cardiology

## 2021-04-01 ENCOUNTER — Telehealth: Payer: Self-pay

## 2021-04-01 NOTE — Telephone Encounter (Signed)
Spoke to the patient.  Two nights ago, patient woke up with chest pain lasting for a few min. Pain resolved on its own. He went to the hospital, ACS was excluded. Stress test was reportedly negative.  He was advised to get a stress echo before flying to [place]. He will back in the [place] in 05/2021.  Pain could be related to GERD (occurred after eating out and couple of drinks) or angina. I recommend stress echo for definitve evaluation. He will call upon his return to the [place] to set up an appt with me.   Elder Negus, MD

## 2021-04-01 NOTE — Telephone Encounter (Signed)
Patient called and has a some questions and concerns that he would like to speak with you about. He prefers not to go through a middle man, because it may be to complicated to explain and he is afraid that we may miss something.Tracy Figueroa

## 2021-04-07 ENCOUNTER — Other Ambulatory Visit: Payer: Self-pay | Admitting: Cardiology

## 2021-05-20 NOTE — Telephone Encounter (Signed)
error 

## 2021-05-28 ENCOUNTER — Other Ambulatory Visit: Payer: Self-pay

## 2021-05-28 MED ORDER — METOPROLOL SUCCINATE ER 25 MG PO TB24: 25.0000 mg | ORAL_TABLET | Freq: Every day | ORAL | 1 refills | Status: DC

## 2021-07-09 ENCOUNTER — Other Ambulatory Visit: Payer: Self-pay | Admitting: Cardiology

## 2021-07-09 ENCOUNTER — Telehealth: Payer: Self-pay

## 2021-07-09 DIAGNOSIS — N529 Male erectile dysfunction, unspecified: Secondary | ICD-10-CM

## 2021-07-09 DIAGNOSIS — I2510 Atherosclerotic heart disease of native coronary artery without angina pectoris: Secondary | ICD-10-CM

## 2021-07-09 IMAGING — CT CT ABD-PELV W/ CM
2 of 5 series · 13 of 46 positions shown, 15 images · IV contrast (iopamidol)
Comparison: None.

CLINICAL DATA: Unintentional weight loss. History of squamous cell
right thigh.

EXAM:
CT CHEST, ABDOMEN, AND PELVIS WITH CONTRAST
TECHNIQUE: Multidetector CT imaging of the chest, abdomen and pelvis was
performed following the standard protocol during bolus
administration of intravenous contrast.
CONTRAST:  100mL B137TI-4BB IOPAMIDOL (B137TI-4BB) INJECTION 61%

[Series 2: cap with 5.00 br40 s3 axial · axial · 0.58mm/px · z∈[+1092,+1667]mm · 10 of 139 slices shown, 12 images]
[im 12/139  soft-tissue]
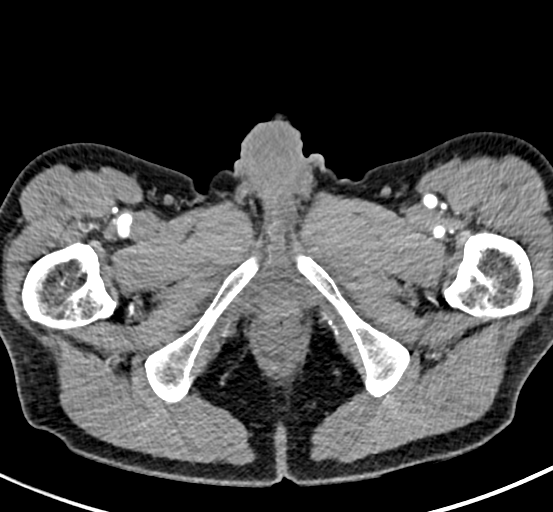
[im 12/139  bone]
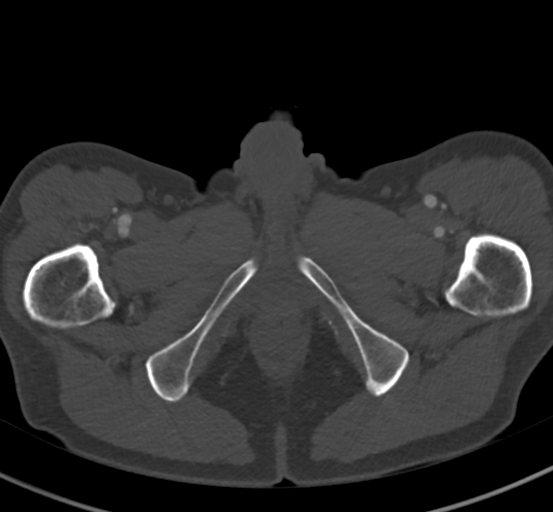
[im 24/139  soft-tissue]
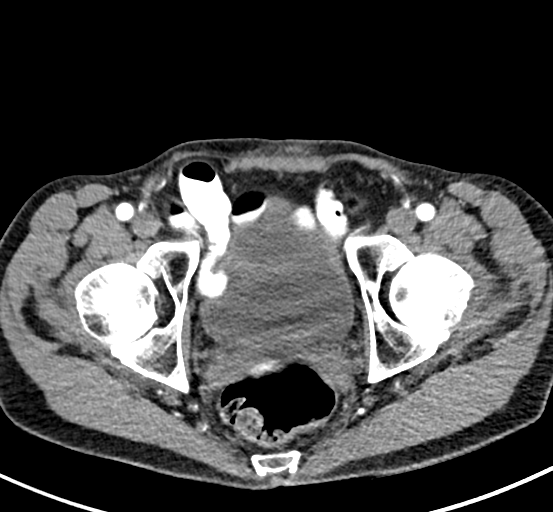
[im 35/139  soft-tissue]
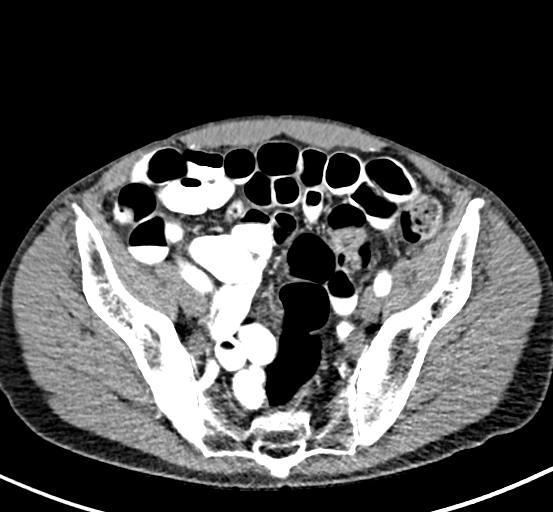
[im 47/139  soft-tissue]
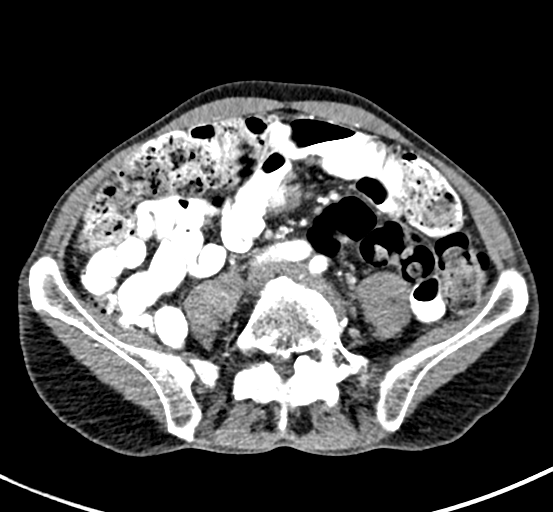
[im 58/139  soft-tissue]
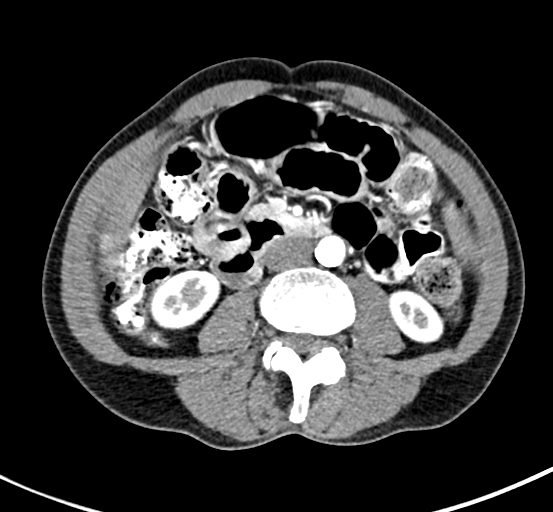
[im 81/139  soft-tissue]
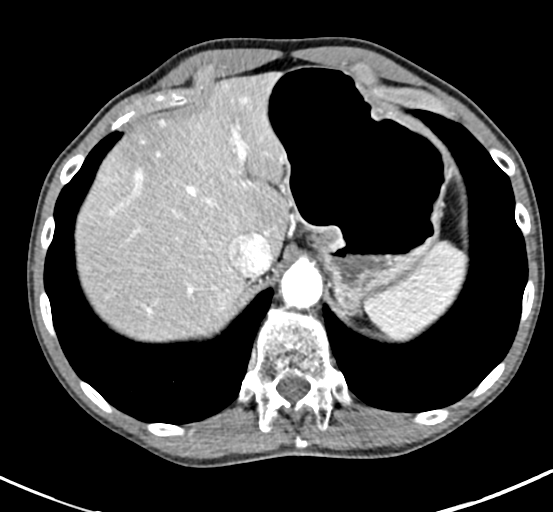
[im 93/139  soft-tissue]
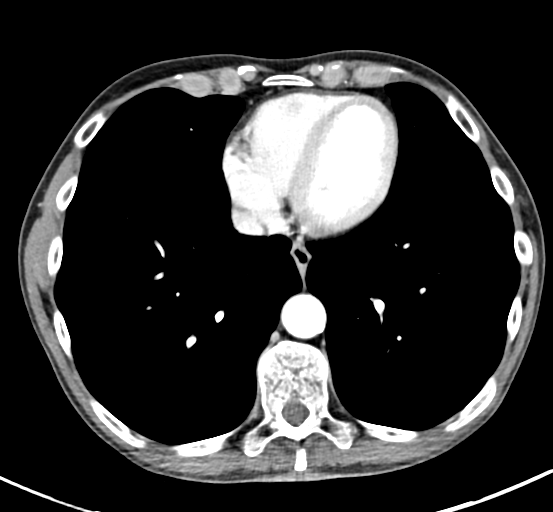
[im 104/139  soft-tissue]
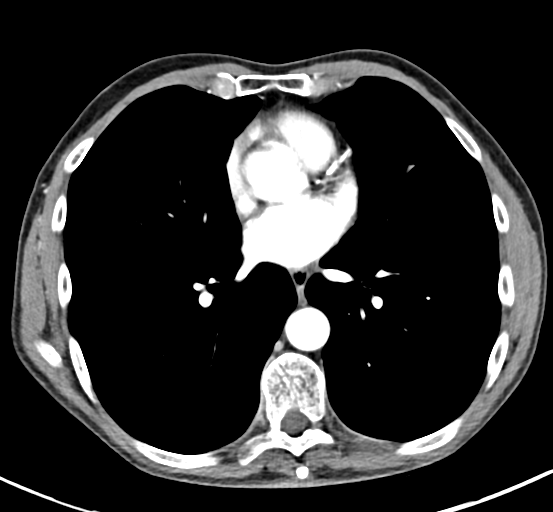
[im 116/139  soft-tissue]
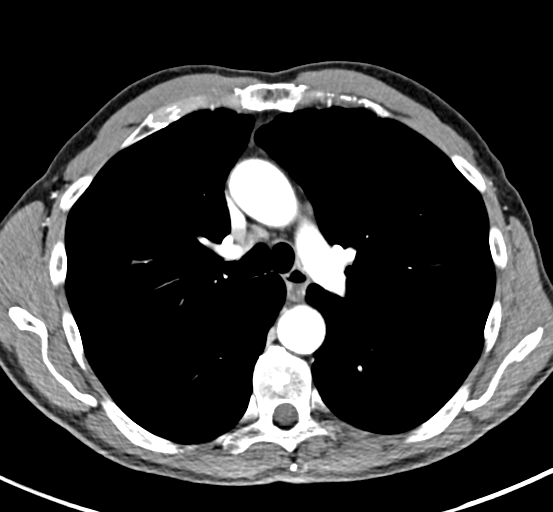
[im 116/139  bone]
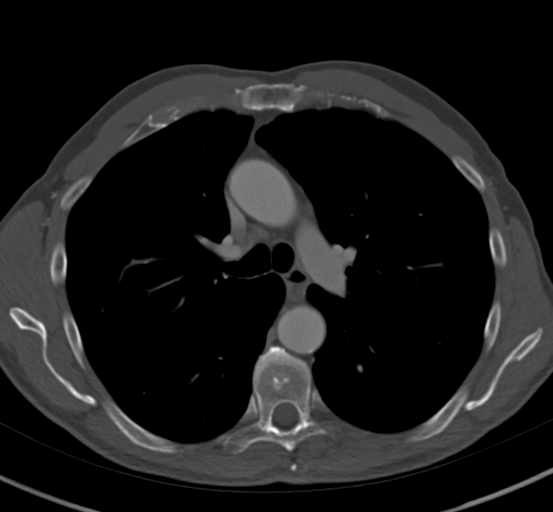
[im 127/139  soft-tissue]
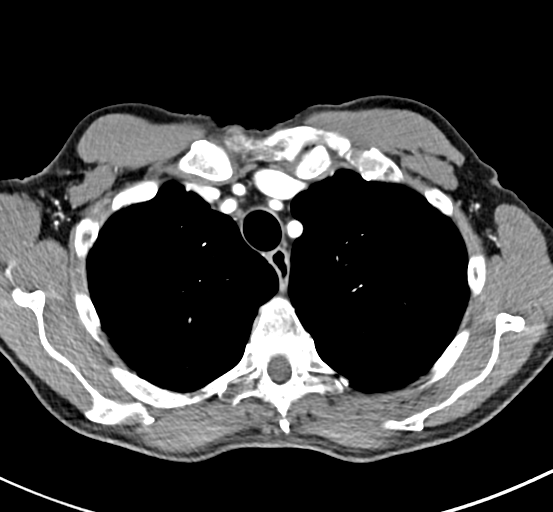

[Series 6: cap with 2.00 br40 s3 cor · coronal · 0.62mm/px · 3 of 145 slices shown]
[im 49/145  soft-tissue]
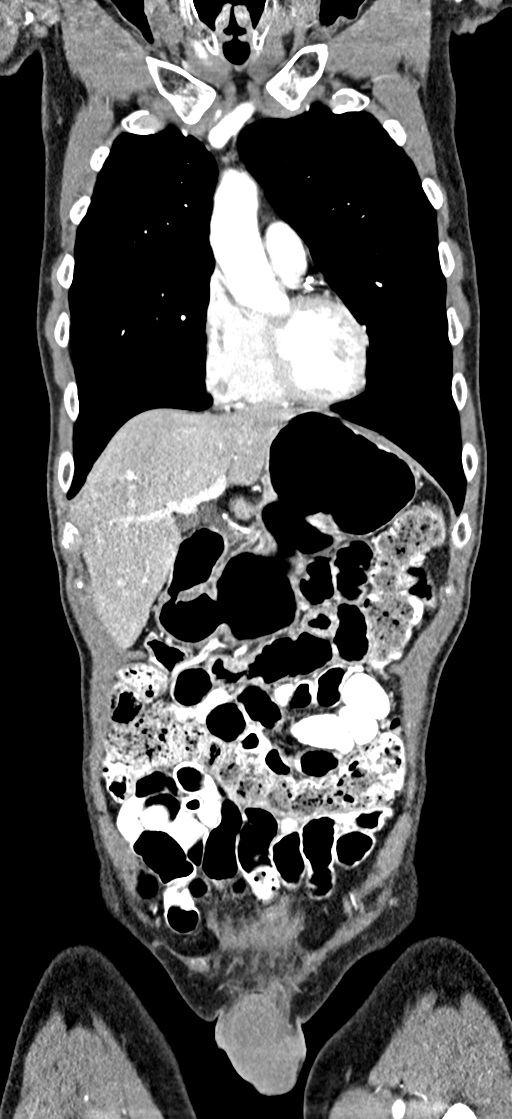
[im 65/145  soft-tissue]
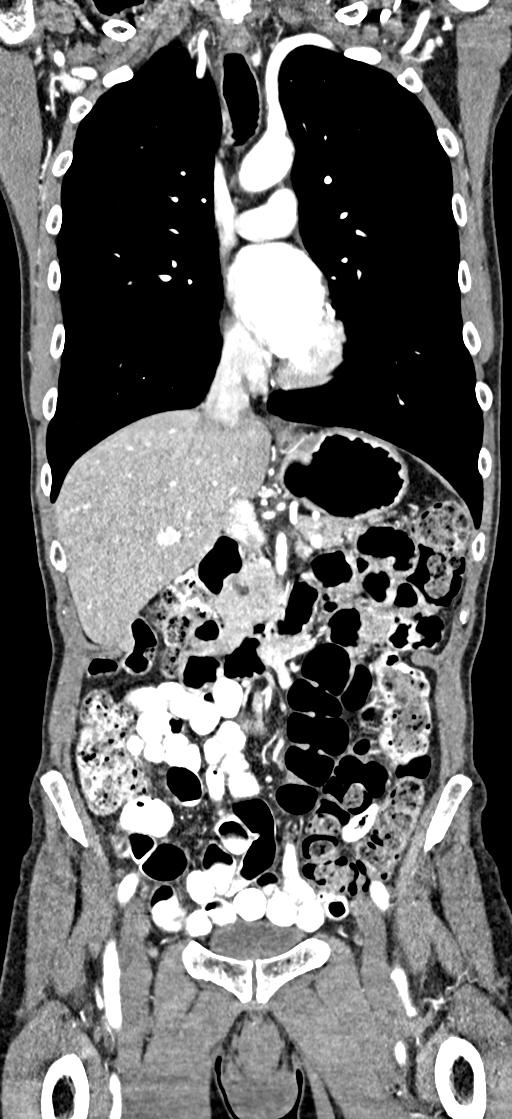
[im 81/145  soft-tissue]
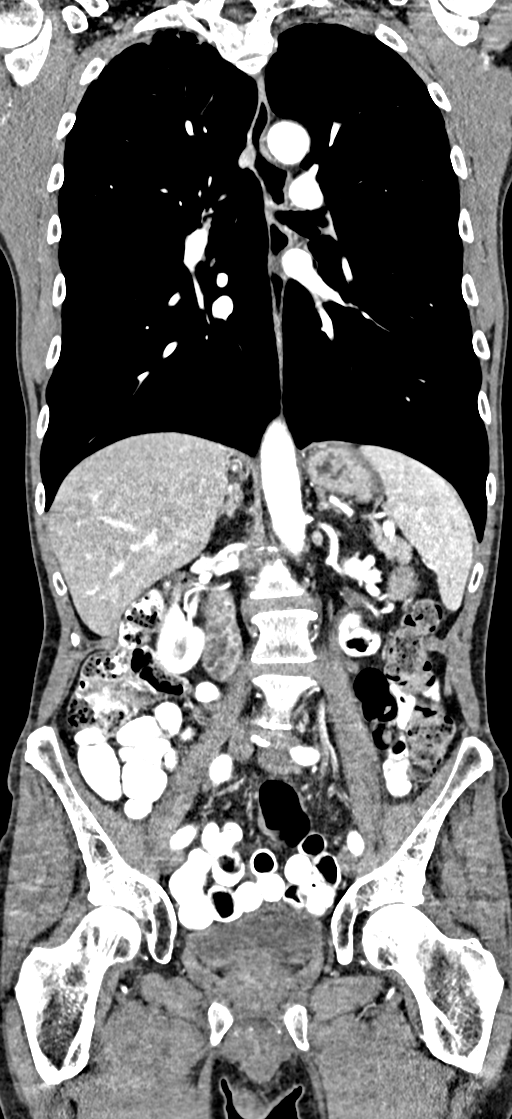

[13 of 46 positions shown; findings below may reference images not displayed]

FINDINGS: CT CHEST FINDINGS

Cardiovascular: Heart size normal. No pericardial effusion. Central
pulmonary arteries normal in caliber. Extensive coronary
calcifications. Thoracic aorta normal in caliber.

Mediastinum/Nodes: No hilar or mediastinal adenopathy.

Lungs/Pleura: No pleural effusion. 4 mm ground glass nodule in the
anterior right upper lobe (Im37,Se4) . lungs otherwise clear. No
pneumothorax.

Musculoskeletal: No chest wall mass or suspicious bone lesions
identified.

CT ABDOMEN PELVIS FINDINGS

Hepatobiliary: No focal liver abnormality is seen. No gallstones,
gallbladder wall thickening, or biliary dilatation.

Pancreas: Unremarkable. No pancreatic ductal dilatation or
surrounding inflammatory changes.

Spleen: Normal in size without focal abnormality.

Adrenals/Urinary Tract: Adrenal glands are unremarkable. Kidneys are
normal, without renal calculi, focal lesion, or hydronephrosis.
Bladder is unremarkable.

Stomach/Bowel: Stomach is physiologically distended. Small bowel is
nondilated. Appendix not identified. Moderate proximal colonic fecal
material without dilatation, decompressed distally.

Vascular/Lymphatic: Aortoiliac atherosclerosis (MB2MH-MUG.G) without
aneurysm or evident stenosis. No abdominal or pelvic adenopathy
identified.

Reproductive: Prominent prostate.

Other: No ascites.  No free air.

Musculoskeletal: Lower lumbar spondylitic changes. No fracture or
worrisome bone lesion.
IMPRESSION: 1. No acute findings.
2. 4 mm subsolid right upper lobe pulmonary nodule. No follow-up
recommended. This recommendation follows the consensus statement:
Guidelines for Management of Incidental Pulmonary Nodules Detected
3. Coronary and aortoiliac Atherosclerosis (MB2MH-MUG.G).

## 2021-07-09 MED ORDER — SILDENAFIL CITRATE 50 MG PO TABS: 50.0000 mg | ORAL_TABLET | Freq: Every day | ORAL | 2 refills | Status: DC | PRN

## 2021-07-09 NOTE — Telephone Encounter (Signed)
Pharmacy comment: Alternative Requested:DRUG NOT COVERED $[id number]+

## 2021-07-09 NOTE — Telephone Encounter (Signed)
Pt stated he has been getting and taking his Viagra from [place] without a prescription. When he asked his PCP here in the [place] if he could get a refill, she told him no and because he has heart problems. He was advised to ask you your opinion. He also said that if you approve, could you send it to [facility] as listed. Please advise.

## 2021-07-09 NOTE — Telephone Encounter (Signed)
Patient aware. He does not have any nitroglycerin.

## 2021-07-09 NOTE — Telephone Encounter (Signed)
Okay to use Viagra as long as not used within 24-48 hrs of nitroglycerin. Prescription sent.  Thanks MJP

## 2021-07-09 NOTE — Telephone Encounter (Signed)
Please let the patient know, and asl the pharmacy if 25 mg sildenafil would be cheaper.   Thanks MJP

## 2021-07-10 ENCOUNTER — Other Ambulatory Visit: Payer: Self-pay

## 2021-07-10 DIAGNOSIS — N529 Male erectile dysfunction, unspecified: Secondary | ICD-10-CM

## 2021-07-10 MED ORDER — SILDENAFIL CITRATE 50 MG PO TABS: 50.0000 mg | ORAL_TABLET | Freq: Every day | ORAL | 2 refills | Status: DC | PRN

## 2021-07-11 NOTE — Telephone Encounter (Signed)
Medication was sent to the wrong pharmacy, will wait for [facility] to contact us regarding medication

## 2021-08-23 ENCOUNTER — Other Ambulatory Visit: Payer: Self-pay

## 2021-08-23 DIAGNOSIS — D3131 Benign neoplasm of right choroid: Secondary | ICD-10-CM | POA: Diagnosis not present

## 2021-08-23 MED ORDER — METOPROLOL SUCCINATE ER 25 MG PO TB24: 25.0000 mg | ORAL_TABLET | Freq: Every day | ORAL | 2 refills | Status: DC

## 2021-09-12 DIAGNOSIS — Z0000 Encounter for general adult medical examination without abnormal findings: Secondary | ICD-10-CM | POA: Diagnosis not present

## 2021-09-12 DIAGNOSIS — Z1389 Encounter for screening for other disorder: Secondary | ICD-10-CM | POA: Diagnosis not present

## 2021-09-19 DIAGNOSIS — I2510 Atherosclerotic heart disease of native coronary artery without angina pectoris: Secondary | ICD-10-CM | POA: Diagnosis not present

## 2021-09-20 LAB — LIPID PANEL
Chol/HDL Ratio: 2 ratio (ref 0.0–5.0)
Cholesterol, Total: 141 mg/dL (ref 100–199)
HDL: 70 mg/dL (ref >39)
LDL Chol Calc (NIH): 56 mg/dL (ref 0–99)
Triglycerides: 74 mg/dL (ref 0–149)
VLDL Cholesterol Cal: 15 mg/dL (ref 5–40)

## 2021-09-23 ENCOUNTER — Encounter: Payer: Self-pay | Admitting: Cardiology

## 2021-09-23 ENCOUNTER — Ambulatory Visit: Payer: Medicare PPO | Admitting: Cardiology

## 2021-09-23 VITALS — BP 120/70 | HR 66 | Temp 98.0°F | Resp 16 | Ht 70.0 in | Wt 149.0 lb

## 2021-09-23 DIAGNOSIS — I10 Essential (primary) hypertension: Secondary | ICD-10-CM | POA: Diagnosis not present

## 2021-09-23 DIAGNOSIS — I2510 Atherosclerotic heart disease of native coronary artery without angina pectoris: Secondary | ICD-10-CM

## 2021-09-23 DIAGNOSIS — N529 Male erectile dysfunction, unspecified: Secondary | ICD-10-CM | POA: Insufficient documentation

## 2021-09-23 NOTE — Progress Notes (Signed)
? ?Follow up visit ? ?Subjective:  ? ?Tracy Figueroa, male    DOB: 1945/12/21, 76 y.o.   MRN: [id number] ? ? ? ?Chief Complaint  ?Patient presents with  ? Coronary Artery Disease  ? Follow-up  ?  1 year  ? ? ? ?76 year old [place] American man with CAD. ? ?He is doing well, and denies chest pain, shortness of breath, palpitations, leg edema, orthopnea, PND, TIA/syncope. He walks at least 10,000 steps couple times a week without any difficulty. He had one episode of retrosternal burning, while in [place]. After extensive workup, it was deemed to be due to acid reflux. Recently, he had an outlier BP of 180/150 mmHg noted at PCP visit, but has otherwise been perfectly normal.  ? ?Current Outpatient Medications:  ?  Ascorbic Acid (VITAMIN C) 1000 MG tablet, Take 1,000 mg by mouth at bedtime. , Disp: , Rfl:  ?  aspirin EC 81 MG tablet, Take 81 mg by mouth daily., Disp: , Rfl:  ?  atorvastatin (LIPITOR) 80 MG tablet, Take 1 tablet (80 mg total) by mouth every evening., Disp: 90 tablet, Rfl: 0 ?  metoprolol succinate (TOPROL-XL) 25 MG 24 hr tablet, Take 1 tablet (25 mg total) by mouth daily., Disp: 90 tablet, Rfl: 2 ?  Multiple Vitamin (MULTIVITAMIN WITH MINERALS) TABS tablet, Take 1 tablet by mouth at bedtime. , Disp: , Rfl:  ?  PRESCRIPTION MEDICATION, Take 12.5 mg by mouth daily. Diupress, Disp: , Rfl:  ?  sildenafil (VIAGRA) 50 MG tablet, Take 1 tablet (50 mg total) by mouth daily as needed for erectile dysfunction. Do not take 48 hrs either side of using nitroglycerin, Disp: 30 tablet, Rfl: 2 ? ?Cardiovascular studies: ? ?EKG 09/23/2021: ?Sinus rhythm 69 bpm ?Normal EKG ? ?Coronary angiography 10/26/2018: ?LM: Normal ?LAD: Mid 20% disease ?LCx: Patent distal LCx stent. 20% prox LCx stenosis ?RCA: Prox calcific 40% stenosis. Widely patent mid RCA stent ?Normal LVEDP ?  ?Impression: ?Patent LCx and RCA stents. ?Otherwise mild nonobstructive CAD, unchanged compared to precious study in 05/2018. ? ?Exercise Myoview  stress test 10/04/2018: ?1. The patient performed treadmill exercise using Bruce protocol, completing 10:45 minutes. The patient completed an estimated workload of 13 METS, reaching 102% of the maximum predicted heart rate. Exercise capacity was excellent. Hemodynamic response was normal. No stress symptoms reported. No ischemic changes seen on stress electrocardiogram.  ?2. The overall quality of the study is excellent. There is no evidence of abnormal lung activity. Stress and rest SPECT images demonstrate homogeneous tracer distribution throughout the myocardium. Gated SPECT imaging reveals normal myocardial thickening and wall motion. The left ventricular ejection fraction was normal (58%).   ?3. Low risk study.  ?  ?Coronary angiography/intervntion 05/2018: ?LM: Normal ?LAD: Mid 20% disease ?LCx: Patent distal LCx stent ?RCA: Mid 99% stenosis ?        Successful PTCA and stent placement ?        Resolute Onyx 4.5 X 15 mm DES ?        0% residual stenosis with excellent apposition and expansion confirmed on IVUS.  ?        Prox calcific 40% stenosis. ?  ?LVEPD Normal ?LVEF 45-50% with inferior hypokinesis. ? ?Recent labs: ?09/19/2021: ?Chol 141, TG 74, HDL 70, LDL 56 ? ?07/2019: ?Chol 133, TG 56, HDL 68, LDL 52 ? ?08/2018: ?Chol 137, TG 69, HDL 68, LDL 55 ?  ?  ? ?Review of Systems  ?Cardiovascular:  Negative for chest pain, dyspnea on exertion,  leg swelling, palpitations and syncope.  ? ?   ? ? ?There were no vitals filed for this visit. ? ? ?Objective:  ? Physical Exam ?Vitals and nursing note reviewed.  ?Constitutional:   ?   General: He is not in acute distress. ?Neck:  ?   Vascular: No JVD.  ?Cardiovascular:  ?   Rate and Rhythm: Normal rate and regular rhythm.  ?   Pulses: Intact distal pulses.  ?   Heart sounds: Normal heart sounds. No murmur heard. ?Pulmonary:  ?   Effort: Pulmonary effort is normal.  ?   Breath sounds: Normal breath sounds. No wheezing or rales.  ?Lymphadenopathy:  ?   Cervical: No  cervical adenopathy.  ? ? ? ?  ICD-10-CM   ?1. Coronary artery disease involving native coronary artery of native heart without angina pectoris  I25.10 EKG 12-Lead  ?  ?2. Essential hypertension  I10   ?  ? ? ? ? ?   ?Assessment & Recommendations:  ? ?76 year old [place] man with CAD. ? 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 ?No angina symptoms. ?Continue Aspirin 81 mg daily, Lipitor 80 mg daily. ?Okay to wean off metoprolol succinate by taking 1/2 pill daily for a week, then every other day for a week, then stop. ? ?Hypertension: ?Controlled. If it increased after stopping metoprolol, could add amlodipine or lisinopril. ? ?F/u in 1 year ? ?Nigel Mormon, MD ?[facility] ?Pager: [phone] ?Office: [phone] ?If no answer Cell [phone] ?   ? ?

## 2021-09-25 DIAGNOSIS — Z1283 Encounter for screening for malignant neoplasm of skin: Secondary | ICD-10-CM | POA: Diagnosis not present

## 2021-09-25 DIAGNOSIS — L820 Inflamed seborrheic keratosis: Secondary | ICD-10-CM | POA: Diagnosis not present

## 2021-09-25 DIAGNOSIS — D225 Melanocytic nevi of trunk: Secondary | ICD-10-CM | POA: Diagnosis not present

## 2021-09-25 DIAGNOSIS — L84 Corns and callosities: Secondary | ICD-10-CM | POA: Diagnosis not present

## 2021-09-26 DIAGNOSIS — Z79899 Other long term (current) drug therapy: Secondary | ICD-10-CM | POA: Diagnosis not present

## 2021-09-26 DIAGNOSIS — N529 Male erectile dysfunction, unspecified: Secondary | ICD-10-CM | POA: Diagnosis not present

## 2021-09-26 DIAGNOSIS — I2510 Atherosclerotic heart disease of native coronary artery without angina pectoris: Secondary | ICD-10-CM | POA: Diagnosis not present

## 2021-09-26 DIAGNOSIS — H918X3 Other specified hearing loss, bilateral: Secondary | ICD-10-CM | POA: Diagnosis not present

## 2021-09-26 DIAGNOSIS — I10 Essential (primary) hypertension: Secondary | ICD-10-CM | POA: Diagnosis not present

## 2021-09-26 DIAGNOSIS — Z86008 Personal history of in-situ neoplasm of other site: Secondary | ICD-10-CM | POA: Diagnosis not present

## 2021-09-26 DIAGNOSIS — E7800 Pure hypercholesterolemia, unspecified: Secondary | ICD-10-CM | POA: Diagnosis not present

## 2021-09-26 DIAGNOSIS — N400 Enlarged prostate without lower urinary tract symptoms: Secondary | ICD-10-CM | POA: Diagnosis not present

## 2021-10-22 ENCOUNTER — Other Ambulatory Visit: Payer: Self-pay | Admitting: Cardiology

## 2021-10-22 DIAGNOSIS — I2510 Atherosclerotic heart disease of native coronary artery without angina pectoris: Secondary | ICD-10-CM

## 2021-10-24 ENCOUNTER — Other Ambulatory Visit: Payer: Self-pay

## 2021-11-21 ENCOUNTER — Telehealth: Payer: Self-pay

## 2021-11-21 NOTE — Telephone Encounter (Signed)
Pt called and stated that he is in [place] and his BP has been normal. However he is confused about his medications. The one he gets while he is out of state is Diupress. He said an active ingredient in this is chlorthalidone and this medication is a diuretic. He is also taking tolterodine which is suppose to reduce frequent urination. He wants to know if we can prescribe another BP med that is not a diuretic. Please advise.

## 2021-11-22 NOTE — Telephone Encounter (Signed)
Called and spoke to patient, he is aware and said he will call back once he get his BP reading.

## 2022-01-02 ENCOUNTER — Telehealth: Payer: Self-pay

## 2022-01-02 NOTE — Telephone Encounter (Signed)
Yes. 10 pillsX3 refills. Discontinue nitroglycerin off the list. Patient aware of avoiding concurrent use, even if he does use nitroglycerin at any time.  Thanks MJP

## 2022-01-03 ENCOUNTER — Other Ambulatory Visit: Payer: Self-pay

## 2022-01-03 DIAGNOSIS — N529 Male erectile dysfunction, unspecified: Secondary | ICD-10-CM

## 2022-01-03 MED ORDER — SILDENAFIL CITRATE 50 MG PO TABS: 50.0000 mg | ORAL_TABLET | Freq: Every day | ORAL | 3 refills | Status: DC | PRN

## 2022-01-03 NOTE — Telephone Encounter (Signed)
Medication has been refilled and patient is aware to not take nitroglycerin

## 2022-07-01 DIAGNOSIS — Z1283 Encounter for screening for malignant neoplasm of skin: Secondary | ICD-10-CM | POA: Diagnosis not present

## 2022-07-01 DIAGNOSIS — L570 Actinic keratosis: Secondary | ICD-10-CM | POA: Diagnosis not present

## 2022-07-01 DIAGNOSIS — L820 Inflamed seborrheic keratosis: Secondary | ICD-10-CM | POA: Diagnosis not present

## 2022-07-01 DIAGNOSIS — D225 Melanocytic nevi of trunk: Secondary | ICD-10-CM | POA: Diagnosis not present

## 2022-07-01 DIAGNOSIS — X32XXXD Exposure to sunlight, subsequent encounter: Secondary | ICD-10-CM | POA: Diagnosis not present

## 2022-07-30 DIAGNOSIS — M25561 Pain in right knee: Secondary | ICD-10-CM | POA: Diagnosis not present

## 2022-07-30 DIAGNOSIS — Z6822 Body mass index (BMI) 22.0-22.9, adult: Secondary | ICD-10-CM | POA: Diagnosis not present

## 2022-09-15 DIAGNOSIS — Z0000 Encounter for general adult medical examination without abnormal findings: Secondary | ICD-10-CM | POA: Diagnosis not present

## 2022-09-15 DIAGNOSIS — Z1389 Encounter for screening for other disorder: Secondary | ICD-10-CM | POA: Diagnosis not present

## 2022-09-15 DIAGNOSIS — Z6822 Body mass index (BMI) 22.0-22.9, adult: Secondary | ICD-10-CM | POA: Diagnosis not present

## 2022-09-22 ENCOUNTER — Encounter: Payer: Self-pay | Admitting: Cardiology

## 2022-09-22 ENCOUNTER — Ambulatory Visit: Payer: Medicare PPO | Admitting: Cardiology

## 2022-09-22 VITALS — BP 125/77 | HR 75 | Resp 16 | Ht 70.0 in | Wt 145.0 lb

## 2022-09-22 DIAGNOSIS — I2510 Atherosclerotic heart disease of native coronary artery without angina pectoris: Secondary | ICD-10-CM | POA: Diagnosis not present

## 2022-09-22 DIAGNOSIS — I10 Essential (primary) hypertension: Secondary | ICD-10-CM

## 2022-09-22 NOTE — Progress Notes (Signed)
Follow up visit  Subjective:   Tracy Figueroa, male    DOB: 05-07-1946, 77 y.o.   MRN: [id number]    Chief Complaint  Patient presents with   Coronary Artery Disease   Follow-up    1 year     77 year old [name] with CAD  Patient has been doing very well, walks 6000-10000 steps daily without any symptoms of chest pain or shortness of breath.  2 weeks ago, patient had an episode of "numbness in the teeth and brief right-sided chest pain lasting for<30 seconds.  Patient has exercise since then with no chest pain symptoms.     Current Outpatient Medications:    Ascorbic Acid (VITAMIN C) 1000 MG tablet, Take 1,000 mg by mouth at bedtime. , Disp: , Rfl:    aspirin EC 81 MG tablet, Take 81 mg by mouth daily., Disp: , Rfl:    atorvastatin (LIPITOR) 80 MG tablet, Take 1 tablet (80 mg total) by mouth every evening., Disp: 90 tablet, Rfl: 0   GEMTESA 75 MG TABS, Oral for 90 Days, Disp: , Rfl:    Multiple Vitamin (MULTIVITAMIN WITH MINERALS) TABS tablet, Take 1 tablet by mouth at bedtime. , Disp: , Rfl:    PRESCRIPTION MEDICATION, Take 12.5 mg by mouth daily. Diupress, Disp: , Rfl:    Protein POWD, as directed Orally almost every day, Disp: , Rfl:    sildenafil (VIAGRA) 50 MG tablet, Take 1 tablet (50 mg total) by mouth daily as needed for erectile dysfunction. Do not take 48 hrs either side of using nitroglycerin, Disp: 10 tablet, Rfl: 3   tolterodine (DETROL LA) 4 MG 24 hr capsule, Take 1 capsule by mouth daily at 6 (six) AM., Disp: , Rfl:   Cardiovascular studies:  EKG 09/22/2022: Sinus rhythm 77 bpm Normal EKG  Coronary angiography 10/26/2018: LM: Normal LAD: Mid 20% disease LCx: Patent distal LCx stent. 20% prox LCx stenosis RCA: Prox calcific 40% stenosis. Widely patent mid RCA stent Normal LVEDP   Impression: Patent LCx and RCA stents. Otherwise mild nonobstructive CAD, unchanged compared to precious study in 05/2018.  Exercise Myoview stress test  10/04/2018: 1. The patient performed treadmill exercise using Bruce protocol, completing 10:45 minutes. The patient completed an estimated workload of 13 METS, reaching 102% of the maximum predicted heart rate. Exercise capacity was excellent. Hemodynamic response was normal. No stress symptoms reported. No ischemic changes seen on stress electrocardiogram.  2. The overall quality of the study is excellent. There is no evidence of abnormal lung activity. Stress and rest SPECT images demonstrate homogeneous tracer distribution throughout the myocardium. Gated SPECT imaging reveals normal myocardial thickening and wall motion. The left ventricular ejection fraction was normal (58%).   3. Low risk study.    Coronary angiography/intervntion 05/2018: LM: Normal LAD: Mid 20% disease LCx: Patent distal LCx stent RCA: Mid 99% stenosis         Successful PTCA and stent placement         Resolute Onyx 4.5 X 15 mm DES         0% residual stenosis with excellent apposition and expansion confirmed on IVUS.          Prox calcific 40% stenosis.   LVEPD Normal LVEF 45-50% with inferior hypokinesis.  Recent labs: 09/19/2021: Chol 141, TG 74, HDL 70, LDL 56  07/2019: Chol 133, TG 56, HDL 68, LDL 52  08/2018: Chol 137, TG 69, HDL 68, LDL 55      Review of  Systems  Cardiovascular:  Negative for chest pain, dyspnea on exertion, leg swelling, palpitations and syncope.         Vitals:   09/22/22 1153  BP: 125/77  Pulse: 75  Resp: 16  SpO2: 97%     Objective:   Physical Exam Vitals and nursing note reviewed.  Constitutional:      General: He is not in acute distress. Neck:     Vascular: No JVD.  Cardiovascular:     Rate and Rhythm: Normal rate and regular rhythm.     Pulses: Intact distal pulses.     Heart sounds: Normal heart sounds. No murmur heard. Pulmonary:     Effort: Pulmonary effort is normal.     Breath sounds: Normal breath sounds. No wheezing or rales.  Musculoskeletal:      Right lower leg: No edema.     Left lower leg: No edema.  Lymphadenopathy:     Cervical: No cervical adenopathy.        ICD-10-CM   1. Coronary artery disease involving native coronary artery of native heart without angina pectoris  I25.10 EKG 12-Lead    Lipid panel    2. Essential hypertension  I10            Assessment & Recommendations:   77 year old [name] with CAD.  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 Recent 30 second episode of precordial pain unlikely to be angina. He has no exertional symptoms. If he has any recurrent chest pain, he will notify me.   Otherwise, continue Aspirin 81 mg daily, Lipitor 80 mg daily.  Hypertension: Controlled.  He takes Amoride that he gets from [place].  F/u in 1 year   Elder Negus, MD Pager: [phone] Office: [phone]

## 2022-09-24 ENCOUNTER — Ambulatory Visit: Payer: Medicare PPO | Admitting: Cardiology

## 2022-09-29 DIAGNOSIS — I10 Essential (primary) hypertension: Secondary | ICD-10-CM | POA: Diagnosis not present

## 2022-09-29 DIAGNOSIS — Z125 Encounter for screening for malignant neoplasm of prostate: Secondary | ICD-10-CM | POA: Diagnosis not present

## 2022-09-29 DIAGNOSIS — E7800 Pure hypercholesterolemia, unspecified: Secondary | ICD-10-CM | POA: Diagnosis not present

## 2022-10-03 ENCOUNTER — Other Ambulatory Visit: Payer: Self-pay

## 2022-10-03 DIAGNOSIS — N529 Male erectile dysfunction, unspecified: Secondary | ICD-10-CM

## 2022-10-03 MED ORDER — SILDENAFIL CITRATE 50 MG PO TABS: 50.0000 mg | ORAL_TABLET | Freq: Every day | ORAL | 0 refills | Status: DC | PRN

## 2022-10-06 DIAGNOSIS — N400 Enlarged prostate without lower urinary tract symptoms: Secondary | ICD-10-CM | POA: Diagnosis not present

## 2022-10-06 DIAGNOSIS — I700 Atherosclerosis of aorta: Secondary | ICD-10-CM | POA: Diagnosis not present

## 2022-10-06 DIAGNOSIS — Z6821 Body mass index (BMI) 21.0-21.9, adult: Secondary | ICD-10-CM | POA: Diagnosis not present

## 2022-10-06 DIAGNOSIS — I10 Essential (primary) hypertension: Secondary | ICD-10-CM | POA: Diagnosis not present

## 2022-10-06 DIAGNOSIS — Z8546 Personal history of malignant neoplasm of prostate: Secondary | ICD-10-CM | POA: Diagnosis not present

## 2022-10-06 DIAGNOSIS — E7800 Pure hypercholesterolemia, unspecified: Secondary | ICD-10-CM | POA: Diagnosis not present

## 2022-10-06 DIAGNOSIS — N529 Male erectile dysfunction, unspecified: Secondary | ICD-10-CM | POA: Diagnosis not present

## 2022-10-06 DIAGNOSIS — Z79899 Other long term (current) drug therapy: Secondary | ICD-10-CM | POA: Diagnosis not present

## 2022-10-06 DIAGNOSIS — I2510 Atherosclerotic heart disease of native coronary artery without angina pectoris: Secondary | ICD-10-CM | POA: Diagnosis not present

## 2022-10-22 DIAGNOSIS — D3131 Benign neoplasm of right choroid: Secondary | ICD-10-CM | POA: Diagnosis not present

## 2022-10-28 DIAGNOSIS — J069 Acute upper respiratory infection, unspecified: Secondary | ICD-10-CM | POA: Diagnosis not present

## 2022-10-28 DIAGNOSIS — Z6822 Body mass index (BMI) 22.0-22.9, adult: Secondary | ICD-10-CM | POA: Diagnosis not present

## 2022-11-13 ENCOUNTER — Other Ambulatory Visit: Payer: Self-pay

## 2022-11-13 ENCOUNTER — Other Ambulatory Visit: Payer: Self-pay | Admitting: Cardiology

## 2022-11-13 ENCOUNTER — Telehealth: Payer: Self-pay

## 2022-11-13 DIAGNOSIS — N529 Male erectile dysfunction, unspecified: Secondary | ICD-10-CM

## 2022-11-13 DIAGNOSIS — I2510 Atherosclerotic heart disease of native coronary artery without angina pectoris: Secondary | ICD-10-CM

## 2022-11-13 MED ORDER — NITROGLYCERIN 0.4 MG SL SUBL
0.4000 mg | SUBLINGUAL_TABLET | SUBLINGUAL | 3 refills | Status: DC | PRN
Start: 2022-11-13 — End: 2023-09-29

## 2022-11-13 MED ORDER — SILDENAFIL CITRATE 50 MG PO TABS
50.0000 mg | ORAL_TABLET | Freq: Every day | ORAL | 0 refills | Status: DC | PRN
Start: 2022-11-13 — End: 2022-11-13

## 2022-11-13 MED ORDER — ATORVASTATIN CALCIUM 80 MG PO TABS: 80.0000 mg | ORAL_TABLET | Freq: Every evening | ORAL | 0 refills | Status: DC

## 2022-11-13 NOTE — Telephone Encounter (Signed)
Patient wants Nitro called in but I do not see it on his medication list.

## 2022-11-13 NOTE — Telephone Encounter (Signed)
I will send.  Thanks MJP

## 2023-01-02 ENCOUNTER — Other Ambulatory Visit: Payer: Self-pay

## 2023-01-02 MED ORDER — ATORVASTATIN CALCIUM 80 MG PO TABS: 80.0000 mg | ORAL_TABLET | Freq: Every evening | ORAL | 1 refills | Status: DC

## 2023-01-14 NOTE — Telephone Encounter (Signed)
done

## 2023-06-01 DIAGNOSIS — H6121 Impacted cerumen, right ear: Secondary | ICD-10-CM | POA: Diagnosis not present

## 2023-06-01 DIAGNOSIS — H9319 Tinnitus, unspecified ear: Secondary | ICD-10-CM | POA: Diagnosis not present

## 2023-06-01 DIAGNOSIS — Z6821 Body mass index (BMI) 21.0-21.9, adult: Secondary | ICD-10-CM | POA: Diagnosis not present

## 2023-06-01 DIAGNOSIS — H918X3 Other specified hearing loss, bilateral: Secondary | ICD-10-CM | POA: Diagnosis not present

## 2023-06-25 DIAGNOSIS — H906 Mixed conductive and sensorineural hearing loss, bilateral: Secondary | ICD-10-CM | POA: Diagnosis not present

## 2023-07-16 ENCOUNTER — Encounter (INDEPENDENT_AMBULATORY_CARE_PROVIDER_SITE_OTHER): Payer: Self-pay | Admitting: Otolaryngology

## 2023-09-09 ENCOUNTER — Encounter (INDEPENDENT_AMBULATORY_CARE_PROVIDER_SITE_OTHER): Payer: Self-pay

## 2023-09-09 ENCOUNTER — Ambulatory Visit (INDEPENDENT_AMBULATORY_CARE_PROVIDER_SITE_OTHER): Payer: Medicare PPO | Admitting: Otolaryngology

## 2023-09-09 VITALS — BP 153/95 | HR 68 | Ht 70.0 in | Wt 146.0 lb

## 2023-09-09 DIAGNOSIS — H905 Unspecified sensorineural hearing loss: Secondary | ICD-10-CM | POA: Diagnosis not present

## 2023-09-09 DIAGNOSIS — H906 Mixed conductive and sensorineural hearing loss, bilateral: Secondary | ICD-10-CM

## 2023-09-09 DIAGNOSIS — H903 Sensorineural hearing loss, bilateral: Secondary | ICD-10-CM

## 2023-09-09 NOTE — Progress Notes (Signed)
 Dear Dr. Katheleen Palmer, Here is my assessment for our mutual patient, Tracy Figueroa. Thank you for allowing me the opportunity to care for your patient. Please do not hesitate to contact me should you have any other questions. Sincerely, Dr. Milon Aloe  Otolaryngology Clinic Note Referring provider: Dr. Katheleen Palmer HPI:  Tracy Figueroa is a 78 y.o. male kindly referred by Dr. Katheleen Palmer for evaluation of hearing loss  Initial visit (09/2023): Patient reports: has noted decline in hearing for about 20 years ago, slowly ongoing since then. Bilateral, left may be slightly worse than right. He then had a HT at [facility] which showed essentially downsloping SNHL (except a conductive component at 500 Hz) and was referred here. He does have occassional non-bothersome non-pulsatile tinnitus. He flies a lot and does not have any problems with it.   Patient denies: ear pain, fullness, vertigo (occurred 20 years ago - after a "kiss on the ear"), drainage Patient additionally denies: deep pain in ear canal, eustachian tube symptoms such as popping/crackling, sensitivity to pressure changes Patient also denies barotrauma, vestibular suppressant use, ototoxic medication use Prior ear surgery: no No family hx of early onset HL; some noise exposure (some concerts)   H&N Surgery: no Personal or FHx of bleeding dz or anesthesia difficulty: no  AP/AC: ASA 81  Tobacco: no  PMHx: CAD, HTN, HLD  Independent Review of Additional Tests or Records:  07/2023 Outside [facility] was independently reviewed and interpreted by me located in media tab and it reveals   AU: Essentially downsloping normal to mod-severe SNHL with some conductive component at 500Hz  with some asymmetry at lower frequencies with right better than left; 96% word interpretation R and 92% left at 65 and 75dB; type A/A tympanogram  SNHL= Sensorineural hearing loss  Tracy Figueroa (06/25/2023) AuD: difficulty hearing gradually; no dizziness,  non-bothersome tinnitus; no pain, no ear surgery  PMH/Meds/All/SocHx/FamHx/ROS:   Past Medical History:  Diagnosis Date   Coronary artery disease    High cholesterol    Hypertension    NSTEMI (non-ST elevated myocardial infarction) (HCC) 05/19/2018     Past Surgical History:  Procedure Laterality Date   CATARACT EXTRACTION W/ INTRAOCULAR LENS  IMPLANT, BILATERAL Bilateral 2000s   CORONARY ANGIOPLASTY WITH STENT PLACEMENT  04/12/2018; 05/20/2018   "in [place]; [facility]"   CORONARY STENT INTERVENTION N/A 05/20/2018   Procedure: CORONARY STENT INTERVENTION;  Surgeon: Cody Das, MD;  Location: [facility] INVASIVE CV LAB;  Service: Cardiovascular;  Laterality: N/A;   FINGER FRACTURE SURGERY Left    "pinky"   FRACTURE SURGERY     INGUINAL HERNIA REPAIR  1958   LEFT HEART CATH AND CORONARY ANGIOGRAPHY N/A 05/20/2018   Procedure: LEFT HEART CATH AND CORONARY ANGIOGRAPHY;  Surgeon: Cody Das, MD;  Location: [facility] INVASIVE CV [facility];  Service: Cardiovascular;  Laterality: N/A;   LEFT HEART CATH AND CORONARY ANGIOGRAPHY N/A 10/26/2018   Procedure: LEFT HEART CATH AND CORONARY ANGIOGRAPHY;  Surgeon: Cody Das, MD;  Location: [facility] INV[facility] CV [facility];  Service: Cardiovascular;  Laterality: N/A;    Family History  Problem Relation Age of Onset   Cancer Father      Social Connections: Not on file      Current Outpatient Medications:    Ascorbic Acid (VITAMIN C) 1000 MG tablet, Take 1,000 mg by mouth at bedtime. , Disp: , Rfl:    aspirin  EC 81 MG tablet, Take 81 mg by mouth daily., Disp: , Rfl:    atorvastatin  (LIPITOR ) 80 MG tablet, Take  1 tablet (80 mg total) by mouth every evening., Disp: 120 tablet, Rfl: 1   GEMTESA 75 MG TABS, Oral for 90 Days, Disp: , Rfl:    Multiple Vitamin (MULTIVITAMIN WITH MINERALS) TABS tablet, Take 1 tablet by mouth at bedtime. , Disp: , Rfl:    PRESCRIPTION MEDICATION, Take 12.5 mg by mouth daily. Diupress, Disp: , Rfl:    Protein POWD, as directed  Orally almost every day, Disp: , Rfl:    aMILoride (MIDAMOR) 5 MG tablet, Take 5 mg by mouth daily. (Patient not taking: Reported on 09/09/2023), Disp: , Rfl:    nitroGLYCERIN  (NITROSTAT ) 0.4 MG SL tablet, Place 1 tablet (0.4 mg total) under the tongue every 5 (five) minutes as needed for chest pain., Disp: 90 tablet, Rfl: 3   tolterodine (DETROL LA) 4 MG 24 hr capsule, Take 1 capsule by mouth daily at 6 (six) AM. (Patient not taking: Reported on 09/09/2023), Disp: , Rfl:    Physical Exam:   BP (!) 153/95 (BP Location: Left Arm, Patient Position: Sitting, Cuff Size: Normal)   Pulse 68   Ht 5\' 10"  (1.778 m)   Wt 146 lb (66.2 kg)   SpO2 99%   BMI 20.95 kg/m   Salient findings:  CN II-XII intact Given history and complaints, ear microscopy was indicated and performed for evaluation with findings as below in physical exam section and in procedures; Bilateral EAC clear and TM intact with well pneumatized middle ear spaces Weber 512: mid Rinne 512: AC > BC b/l  Anterior rhinoscopy: Septum relatively midline; no purulence No lesions of oral cavity/oropharynx No obviously palpable neck masses/lymphadenopathy/thyromegaly No respiratory distress or stridor  Seprately Identifiable Procedures:  Procedure: Bilateral ear microscopy using microscope (CPT 92504) Pre-procedure diagnosis: mixed hearing loss, rule out structural cause Post-procedure diagnosis: same Indication: see above; given patient's otologic complaints and history, for improved and comprehensive examination of external ear and tympanic membrane, bilateral otologic examination using microscope was performed  Procedure: Patient was placed semi-recumbent. Both ear canals were examined using the microscope with findings above Patient tolerated the procedure well.   Impression & Plans:  Tracy Figueroa is a 78 y.o. male with:  1. Asymmetric SNHL (sensorineural hearing loss)   2. Mixed hearing loss, bilateral    Essentially SNHL  except for some mixed loss at 500dB; we discussed options including CT, MRI, and further workup. Given mostly SNHL, and he would like to avoid any intervention, declined CT. Will get MRI to r/o retrocochlear lesion.   - Cleared for hearing aids - Will get MRI F/u PRN depending on results - will call with MRI results; he was asked to notify us  when he does get it; gets his HA and hearing care at [facility] where he'd like to maintain it.  See below regarding exact medications prescribed this encounter including dosages and route: No orders of the defined types were placed in this encounter.     Thank you for allowing me the opportunity to care for your patient. Please do not hesitate to contact me should you have any other questions.  Sincerely, Milon Aloe, MD Otolaryngologist (ENT), [facility] Phone: [phone] Fax: [phone]  09/27/2023, 8:47 AM   I have personally spent 48 minutes involved in face-to-face and non-face-to-face activities for this patient on the day of the visit.  Professional time spent excludes any procedures performed but includes the following activities, in addition to those noted in the documentation: preparing to see the patient (review of outside documentation  and results), performing a medically appropriate examination, counseling, documenting in the electronic health record, independently interpreting results (Audiogram)

## 2023-09-09 NOTE — Patient Instructions (Signed)
 I have ordered an imaging study for you to complete prior to your next visit. Please call [facility] Scheduling at [phone] to schedule your imaging if you have not received a call within 24 hours. If you are unable to complete your imaging study prior to your next scheduled visit please call our office to let us know.

## 2023-09-15 ENCOUNTER — Ambulatory Visit (HOSPITAL_COMMUNITY)
Admission: RE | Admit: 2023-09-15 | Discharge: 2023-09-15 | Disposition: A | Discharge status: Home / Self Care | Source: Ambulatory Visit | Attending: Otolaryngology | Admitting: Otolaryngology

## 2023-09-15 DIAGNOSIS — H906 Mixed conductive and sensorineural hearing loss, bilateral: Secondary | ICD-10-CM | POA: Diagnosis not present

## 2023-09-15 DIAGNOSIS — J341 Cyst and mucocele of nose and nasal sinus: Secondary | ICD-10-CM | POA: Diagnosis not present

## 2023-09-15 DIAGNOSIS — H9193 Unspecified hearing loss, bilateral: Secondary | ICD-10-CM | POA: Diagnosis not present

## 2023-09-15 DIAGNOSIS — H903 Sensorineural hearing loss, bilateral: Secondary | ICD-10-CM | POA: Insufficient documentation

## 2023-09-15 DIAGNOSIS — J329 Chronic sinusitis, unspecified: Secondary | ICD-10-CM | POA: Diagnosis not present

## 2023-09-15 DIAGNOSIS — I6782 Cerebral ischemia: Secondary | ICD-10-CM | POA: Diagnosis not present

## 2023-09-15 MED ORDER — GADOBUTROL 1 MMOL/ML IV SOLN
6.5000 mL | Freq: Once | INTRAVENOUS | Status: AC | PRN
  Administered 2023-09-15: 6.5 mL via INTRAVENOUS

## 2023-09-16 DIAGNOSIS — Z6822 Body mass index (BMI) 22.0-22.9, adult: Secondary | ICD-10-CM | POA: Diagnosis not present

## 2023-09-16 DIAGNOSIS — Z0000 Encounter for general adult medical examination without abnormal findings: Secondary | ICD-10-CM | POA: Diagnosis not present

## 2023-09-16 DIAGNOSIS — E7800 Pure hypercholesterolemia, unspecified: Secondary | ICD-10-CM | POA: Diagnosis not present

## 2023-09-16 DIAGNOSIS — I10 Essential (primary) hypertension: Secondary | ICD-10-CM | POA: Diagnosis not present

## 2023-09-16 DIAGNOSIS — H918X3 Other specified hearing loss, bilateral: Secondary | ICD-10-CM | POA: Diagnosis not present

## 2023-09-16 DIAGNOSIS — Z1331 Encounter for screening for depression: Secondary | ICD-10-CM | POA: Diagnosis not present

## 2023-09-16 DIAGNOSIS — N401 Enlarged prostate with lower urinary tract symptoms: Secondary | ICD-10-CM | POA: Diagnosis not present

## 2023-09-21 DIAGNOSIS — Z01110 Encounter for hearing examination following failed hearing screening: Secondary | ICD-10-CM | POA: Diagnosis not present

## 2023-09-23 ENCOUNTER — Ambulatory Visit: Payer: Self-pay | Admitting: Cardiology

## 2023-09-27 ENCOUNTER — Encounter (INDEPENDENT_AMBULATORY_CARE_PROVIDER_SITE_OTHER): Payer: Self-pay | Admitting: Otolaryngology

## 2023-09-28 DIAGNOSIS — D3131 Benign neoplasm of right choroid: Secondary | ICD-10-CM | POA: Diagnosis not present

## 2023-09-28 NOTE — Progress Notes (Unsigned)
  Cardiology Office Note:  .   Date:  09/28/2023  ID:  Rosco Companion [place], DOB [birth year], MRN [id number] PCP: Annamarie Kid, MD  [facility] Providers Cardiologist:  Fransico Ivy, MD PCP: Annamarie Kid, MD  No chief complaint on file.    Ahmar Sieg is a 78 y.o. male with CAD  Patient underwent PCI to distal left circumflex in [place] in 2019, followed by mid RCA PCI with resolute Onyx 4.5 x 50 mm stent by me.  Last cath in 2020 showed patent stents.  Discussed the use of AI scribe software for clinical note transcription with the patient, who gave verbal consent to proceed.  History of Present Illness       There were no vitals filed for this visit.    ROS      Studies Reviewed: .        *** Independently interpreted ***/202***: Chol ***, TG ***, HDL ***, LDL *** HbA1C ***% Hb *** Cr *** ***  Risk Assessment/Calculations:   {Does this patient have ATRIAL FIBRILLATION?:[phone]}    Physical Exam   VISIT DIAGNOSES: No diagnosis found.   Jamesen Scalone is a 78 y.o. male with CAD  Assessment and Plan Assessment & Plan  CAD: S/p Lcx PCI in [place] for unstable angina 04/2018, mid RCA PCI for NSTEMI 05/2018 by me. *** Continue Aspirin  81 mg daily, Lipitor  80 mg daily.   Hypertension: Controlled.  He takes Amoride that he gets from [place].        {Are you ordering a CV Procedure (e.g. stress test, cath, DCCV, TEE, etc)?   Press F2        :[id number]}    No orders of the defined types were placed in this encounter.    F/u in ***  Signed, Cody Das, MD

## 2023-09-29 ENCOUNTER — Encounter: Payer: Self-pay | Admitting: Cardiology

## 2023-09-29 ENCOUNTER — Ambulatory Visit: Payer: Medicare PPO | Attending: Cardiology | Admitting: Cardiology

## 2023-09-29 VITALS — BP 132/70 | HR 61 | Resp 16 | Ht 70.0 in | Wt 154.2 lb

## 2023-09-29 DIAGNOSIS — I10 Essential (primary) hypertension: Secondary | ICD-10-CM | POA: Diagnosis not present

## 2023-09-29 DIAGNOSIS — I2510 Atherosclerotic heart disease of native coronary artery without angina pectoris: Secondary | ICD-10-CM

## 2023-09-29 MED ORDER — NITROGLYCERIN 0.4 MG SL SUBL: 0.4000 mg | SUBLINGUAL_TABLET | SUBLINGUAL | 1 refills | Status: DC | PRN

## 2023-09-29 NOTE — Patient Instructions (Addendum)
 Medication Instructions:  REFILLED AS NEEDED FOR CHEST PAIN: Nitroglycerin    *If you need a refill on your cardiac medications before your next appointment, please call your pharmacy*   Testing/Procedures: Echo  Your physician has requested that you have an echocardiogram. Echocardiography is a painless test that uses sound waves to create images of your heart. It provides your doctor with information about the size and shape of your heart and how well your heart's chambers and valves are working. This procedure takes approximately one hour. There are no restrictions for this procedure. Please do NOT wear cologne, perfume, aftershave, or lotions (deodorant is allowed). Please arrive 15 minutes prior to your appointment time.  Please note: We ask at that you not bring children with you during ultrasound (echo/ vascular) testing. Due to room size and safety concerns, children are not allowed in the ultrasound rooms during exams. Our front office staff cannot provide observation of children in our lobby area while testing is being conducted. An adult accompanying a patient to their appointment will only be allowed in the ultrasound room at the discretion of the ultrasound technician under special circumstances. We apologize for any inconvenience.   Follow-Up: At [facility], you and your health needs are our priority.  As part of our continuing mission to provide you with exceptional heart care, our providers are all part of one team.  This team includes your primary Cardiologist (physician) and Advanced Practice Providers or APPs (Physician Assistants and Nurse Practitioners) who all work together to provide you with the care you need, when you need it.  Your next appointment:   As needed   Provider:   Cody Das, MD

## 2023-09-30 DIAGNOSIS — Z23 Encounter for immunization: Secondary | ICD-10-CM | POA: Diagnosis not present

## 2023-09-30 DIAGNOSIS — E7800 Pure hypercholesterolemia, unspecified: Secondary | ICD-10-CM | POA: Diagnosis not present

## 2023-09-30 DIAGNOSIS — I10 Essential (primary) hypertension: Secondary | ICD-10-CM | POA: Diagnosis not present

## 2023-10-18 ENCOUNTER — Other Ambulatory Visit: Payer: Self-pay | Admitting: Cardiology

## 2023-10-20 DIAGNOSIS — Z1283 Encounter for screening for malignant neoplasm of skin: Secondary | ICD-10-CM | POA: Diagnosis not present

## 2023-10-20 DIAGNOSIS — L603 Nail dystrophy: Secondary | ICD-10-CM | POA: Diagnosis not present

## 2023-10-20 DIAGNOSIS — B351 Tinea unguium: Secondary | ICD-10-CM | POA: Diagnosis not present

## 2023-10-20 DIAGNOSIS — C44319 Basal cell carcinoma of skin of other parts of face: Secondary | ICD-10-CM | POA: Diagnosis not present

## 2023-10-20 DIAGNOSIS — D225 Melanocytic nevi of trunk: Secondary | ICD-10-CM | POA: Diagnosis not present

## 2023-10-23 DIAGNOSIS — R3914 Feeling of incomplete bladder emptying: Secondary | ICD-10-CM | POA: Diagnosis not present

## 2023-10-23 DIAGNOSIS — N3941 Urge incontinence: Secondary | ICD-10-CM | POA: Diagnosis not present

## 2023-10-23 DIAGNOSIS — N401 Enlarged prostate with lower urinary tract symptoms: Secondary | ICD-10-CM | POA: Diagnosis not present

## 2023-11-16 ENCOUNTER — Ambulatory Visit (HOSPITAL_COMMUNITY)
Admission: RE | Admit: 2023-11-16 | Discharge: 2023-11-16 | Disposition: A | Discharge status: Home / Self Care | Source: Ambulatory Visit | Attending: Cardiology | Admitting: Cardiology

## 2023-11-16 ENCOUNTER — Ambulatory Visit: Payer: Self-pay | Admitting: Cardiology

## 2023-11-16 DIAGNOSIS — I10 Essential (primary) hypertension: Secondary | ICD-10-CM | POA: Diagnosis not present

## 2023-11-16 DIAGNOSIS — I2510 Atherosclerotic heart disease of native coronary artery without angina pectoris: Secondary | ICD-10-CM

## 2023-11-16 LAB — ECHOCARDIOGRAM COMPLETE
Area-P 1/2: 3.75 cm2
S' Lateral: 3 cm

## 2023-11-17 DIAGNOSIS — Z85828 Personal history of other malignant neoplasm of skin: Secondary | ICD-10-CM | POA: Diagnosis not present

## 2023-11-17 DIAGNOSIS — Z08 Encounter for follow-up examination after completed treatment for malignant neoplasm: Secondary | ICD-10-CM | POA: Diagnosis not present

## 2023-11-19 ENCOUNTER — Other Ambulatory Visit: Payer: Self-pay | Admitting: Cardiology

## 2023-11-19 DIAGNOSIS — N529 Male erectile dysfunction, unspecified: Secondary | ICD-10-CM

## 2024-01-06 ENCOUNTER — Other Ambulatory Visit: Payer: Self-pay | Admitting: Cardiology

## 2024-01-06 DIAGNOSIS — N529 Male erectile dysfunction, unspecified: Secondary | ICD-10-CM

## 2024-01-06 MED ORDER — ATORVASTATIN CALCIUM 80 MG PO TABS: 80.0000 mg | ORAL_TABLET | Freq: Every evening | ORAL | 3 refills | Status: AC

## 2024-01-09 ENCOUNTER — Other Ambulatory Visit: Payer: Self-pay | Admitting: Cardiology

## 2024-01-09 DIAGNOSIS — N529 Male erectile dysfunction, unspecified: Secondary | ICD-10-CM

## 2024-01-13 NOTE — Telephone Encounter (Signed)
 I have filled his sildenafil  in the past, as PCP's are sometimes not comfortable filling this in cardiac patients. His CAD is stable and he is not using any nitroglycerin , therefore, okay to use sildenafil  with the caution that he must not use any nitrates on at least 36 hours before or after using sildenafil . Please discontinue nitroglycerin  on his chart as you fill sildenafil .  Finally, at last visit, I made his PRN for follow up. Therefore, I recommend future refills to be done through PCP. You may send 90 pills with no refills from us .   Thanks MJP
